# Patient Record
Sex: Female | Born: 1975 | Race: White | Hispanic: No | Marital: Married | Smoking: Never smoker
Health system: Southern US, Community
[De-identification: ages and names within clinical notes are randomized; demographics above are authoritative.]

## PROBLEM LIST (undated history)

## (undated) DIAGNOSIS — G43909 Migraine, unspecified, not intractable, without status migrainosus: Secondary | ICD-10-CM

## (undated) DIAGNOSIS — T7840XA Allergy, unspecified, initial encounter: Secondary | ICD-10-CM

## (undated) DIAGNOSIS — J45909 Unspecified asthma, uncomplicated: Secondary | ICD-10-CM

## (undated) DIAGNOSIS — R51 Headache: Secondary | ICD-10-CM

## (undated) DIAGNOSIS — R519 Headache, unspecified: Secondary | ICD-10-CM

## (undated) HISTORY — DX: Migraine, unspecified, not intractable, without status migrainosus: G43.909

## (undated) HISTORY — DX: Allergy, unspecified, initial encounter: T78.40XA

## (undated) HISTORY — DX: Unspecified asthma, uncomplicated: J45.909

## (undated) HISTORY — DX: Headache, unspecified: R51.9

## (undated) HISTORY — DX: Headache: R51

---

## 2001-04-17 ENCOUNTER — Other Ambulatory Visit: Admission: RE | Admit: 2001-04-17 | Discharge: 2001-04-17 | Payer: Self-pay | Admitting: Obstetrics & Gynecology

## 2001-09-02 ENCOUNTER — Inpatient Hospital Stay (HOSPITAL_COMMUNITY): Admission: AD | Admit: 2001-09-02 | Discharge: 2001-09-02 | Payer: Self-pay | Admitting: Obstetrics and Gynecology

## 2001-10-18 ENCOUNTER — Inpatient Hospital Stay (HOSPITAL_COMMUNITY): Admission: AD | Admit: 2001-10-18 | Discharge: 2001-10-18 | Payer: Self-pay | Admitting: Obstetrics & Gynecology

## 2001-10-18 ENCOUNTER — Encounter: Payer: Self-pay | Admitting: Obstetrics and Gynecology

## 2001-10-21 ENCOUNTER — Inpatient Hospital Stay (HOSPITAL_COMMUNITY): Admission: AD | Admit: 2001-10-21 | Discharge: 2001-10-21 | Payer: Self-pay | Admitting: Obstetrics and Gynecology

## 2001-10-21 ENCOUNTER — Encounter: Payer: Self-pay | Admitting: Obstetrics & Gynecology

## 2001-11-01 ENCOUNTER — Inpatient Hospital Stay (HOSPITAL_COMMUNITY): Admission: AD | Admit: 2001-11-01 | Discharge: 2001-11-04 | Payer: Self-pay | Admitting: Obstetrics and Gynecology

## 2002-08-31 ENCOUNTER — Emergency Department (HOSPITAL_COMMUNITY): Admission: EM | Admit: 2002-08-31 | Discharge: 2002-08-31 | Payer: Self-pay | Admitting: Emergency Medicine

## 2003-02-19 ENCOUNTER — Ambulatory Visit (HOSPITAL_COMMUNITY): Admission: RE | Admit: 2003-02-19 | Discharge: 2003-02-19 | Payer: Self-pay | Admitting: Obstetrics & Gynecology

## 2003-02-19 ENCOUNTER — Encounter: Payer: Self-pay | Admitting: Obstetrics & Gynecology

## 2003-02-20 ENCOUNTER — Encounter: Admission: RE | Admit: 2003-02-20 | Discharge: 2003-02-20 | Payer: Self-pay | Admitting: Obstetrics & Gynecology

## 2003-02-20 ENCOUNTER — Encounter: Payer: Self-pay | Admitting: Obstetrics & Gynecology

## 2004-03-11 ENCOUNTER — Other Ambulatory Visit: Admission: RE | Admit: 2004-03-11 | Discharge: 2004-03-11 | Payer: Self-pay | Admitting: Obstetrics & Gynecology

## 2004-09-09 ENCOUNTER — Encounter: Admission: RE | Admit: 2004-09-09 | Discharge: 2004-09-09 | Payer: Self-pay | Admitting: Obstetrics and Gynecology

## 2005-02-24 ENCOUNTER — Encounter: Admission: RE | Admit: 2005-02-24 | Discharge: 2005-02-24 | Payer: Self-pay | Admitting: Obstetrics & Gynecology

## 2005-03-06 ENCOUNTER — Encounter: Admission: RE | Admit: 2005-03-06 | Discharge: 2005-03-06 | Payer: Self-pay | Admitting: Obstetrics & Gynecology

## 2005-09-01 ENCOUNTER — Encounter: Admission: RE | Admit: 2005-09-01 | Discharge: 2005-09-01 | Payer: Self-pay | Admitting: Obstetrics & Gynecology

## 2005-09-06 ENCOUNTER — Encounter: Admission: RE | Admit: 2005-09-06 | Discharge: 2005-09-06 | Payer: Self-pay | Admitting: Obstetrics & Gynecology

## 2006-02-26 ENCOUNTER — Encounter: Admission: RE | Admit: 2006-02-26 | Discharge: 2006-02-26 | Payer: Self-pay | Admitting: Obstetrics & Gynecology

## 2007-03-22 ENCOUNTER — Encounter: Admission: RE | Admit: 2007-03-22 | Discharge: 2007-03-22 | Payer: Self-pay | Admitting: Obstetrics & Gynecology

## 2007-06-26 ENCOUNTER — Encounter: Admission: RE | Admit: 2007-06-26 | Discharge: 2007-06-26 | Payer: Self-pay | Admitting: Obstetrics & Gynecology

## 2007-10-01 ENCOUNTER — Encounter: Admission: RE | Admit: 2007-10-01 | Discharge: 2007-10-01 | Payer: Self-pay | Admitting: Obstetrics & Gynecology

## 2010-04-15 ENCOUNTER — Ambulatory Visit: Payer: Self-pay | Admitting: Family Medicine

## 2010-04-15 DIAGNOSIS — M7750 Other enthesopathy of unspecified foot: Secondary | ICD-10-CM | POA: Insufficient documentation

## 2010-04-15 DIAGNOSIS — M25579 Pain in unspecified ankle and joints of unspecified foot: Secondary | ICD-10-CM | POA: Insufficient documentation

## 2010-05-25 ENCOUNTER — Ambulatory Visit: Payer: Self-pay | Admitting: Sports Medicine

## 2010-12-20 NOTE — Assessment & Plan Note (Signed)
Summary: F/U FOOT,MC   History of Present Illness: 35 yo F here for 6 week f/u of left peroneal tendinopathy.  patient reports she is 98% improved. Used voltaren gel and aleve for a week after last visit. Had cross trained and continues to do so Running 15 miles a week Only pain in past couple weeks was when she ran at the beach in sand. No swelling or bruising. using comforthotics with scaphoid pads - felt too post so moved forward.  Allergies (verified): 1)  ! Phenergan  Physical Exam  General:  Well-developed,well-nourished,in no acute distress; alert,appropriate and cooperative throughout examination Msk:  L foot/ankle: No gross deformity, bruising.  No swelling. No TTP peroneal tendons - much improved. No TTP lat malleolus, base 5th metatarsal, or elsewhere about foot or ankle. FROM ankle. No pain with resisted plantarflexion and ext rotation. Neg ant drawer and talar tilt. NV intact distally. Cavus feet bilaterally   Impression & Recommendations:  Problem # 1:  ANKLE PAIN, LEFT (ICD-719.47) Assessment Improved Made new pair of insoles with scaphoid pads.  Theraband exercises - internal rotation and plantarflexion 3 sets of 15 at least 3 times a week for 6 weeks.  Given website and shown how to order insoles/pads.  Consider custom orthotics - will check with her insurance company.  F/u as needed.  Orders: Sports Insoles (L3510) Theraband per yard (A9300)  Problem # 2:  OTHER ENTHESOPATHY OF ANKLE AND TARSUS (ICD-726.79) Assessment: Improved  Orders: Sports Insoles (L3510) Theraband per yard (A9300)  Other Orders: Foot Orthosis ( Arch Strap/Heel Cup) [phone])

## 2010-12-20 NOTE — Assessment & Plan Note (Signed)
Summary: NP,ANKLE/LEG PAIN,RUNNER,MC   Vital Signs:  Patient profile:   35 year old female Height:      68 inches Weight:      147 pounds BMI:     22.43 BP sitting:   116 / 74  Vitals Entered By: Lillia Pauls CMA (Apr 15, 2010 8:56 AM)  History of Present Illness: 35 yo F here for new left foot/ankle pain  Patient reports 3 1/2 weeks of slowly worsening left lateral foot pain No known injury Started initially after she was running Now hurts through entire run Has had some swelling No pop or bruising. No prior h/o stress fracture Tried voltaren gel which did help some Feels good some days and tries to go out and run - worsens pain. Usually runs 16-18 miles per week (3-4 times a week running, elliptical to cross train) Hurts some with elliptical as well. May have started when she was running on an irregular surface.  Allergies (verified): 1)  ! Phenergan  Physical Exam  General:  Well-developed,well-nourished,in no acute distress; alert,appropriate and cooperative throughout examination Msk:  L foot/ankle: No gross deformity, bruising.  ? ice burn lateral ankle.  Minimal increased swelling post to lat malleolus compared to R. TTP peroneal tendons as they cross malleolus.  No TTP lat malleolus, base 5th metatarsal, or elsewhere about foot or ankle. FROM ankle. Mild pain with resisted plantarflexion and ext rotation. Neg ant drawer and talar tilt. NV intact distally. Cavus feet bilaterally No leg length inequality Additional Exam:  MSK u/s L ankle:  No edema, bony irregularities, or increased neovascularity over base 5th or lateral malleolus.  No visible tears within peroneal tendons though they are thickened relative to right sided peroneal tendons.  Small increased fluid surrounding tendons compared to right as well.  No increased neovascularity however.   Impression & Recommendations:  Problem # 1:  OTHER ENTHESOPATHY OF ANKLE AND TARSUS (ICD-726.79) Assessment  New  Peroneal tendinopathy.  Relative rest - back off running for next 2 weeks, focus on cross training.  Running on uneven surface + cavus feet seem to be factors leading to this.  Given comforthotics with small scaphoid pads to help distibute force across feet and cause less stress on peroneal tendons - very neutral gait in these and no increased pain.  Voltaren gel and/or aleve for pain.  At next visit consider theraband exercises and/or formal PT.  Orders: Sports Insoles [phone])  Problem # 2:  ANKLE PAIN, LEFT (ICD-719.47) Assessment: New  See #1 above.  Orders: Sports Insoles [phone])  Patient Instructions: 1)  You have peroneal tendinitis. 2)  Wear the green insoles with scaphoid pads - slip these in and out of your other shoes as well. 3)  Ice the area (ice bucket if you can tolerate) for 10 minutes at a time up to 3-4 times a day. 4)  Focus on cross training for the next 2 weeks without running - biking would be ideal and elliptical is ok if it does not increase your pain. 5)  In 2 weeks start a walk:jog program to see how you tolerate this.  Start with 1 minute:1 minute for 15 minutes - increase the jog time as long as pain does not increase. 6)  Use voltaren gel over the area or you can try aleve 2 tablets twice a day with food. 7)  Follow up with [place] in 4 weeks to see how you are doing with this.

## 2011-04-07 NOTE — H&P (Signed)
[facility]  Patient:    Latasha Black, Latasha Black Visit Number: [id number] MRN: [id number]          Service Type: OBS Location: [facility] Attending Physician:  Lenoard Aden Dictated by:   Silverio Lay, M.D. Admit Date:  10/21/2001 Discharge Date: 10/21/2001                           History and Physical  REASON FOR ADMISSION:         Intrauterine pregnancy at 37 weeks with regular uterine contractions.  HISTORY OF PRESENT ILLNESS:   This is a 35 year old married white female, gravida 2, para 1, with a due date of November 24, 2001, being admitted at 37 weeks with regular uterine contractions and cervical change.  She has been experiencing contractions every three minutes, which have increased in intensity after walking for an hour.  Denied any leaking of fluid, denied any bleeding and reported good fetal activity.  She also denies pregnancy-induced hypertension symptoms.  She has shown cervical change after allowing her to ambulate for an hour and a half and so decision was made to admit her in labor and delivery.  Prenatal course reveals blood type A-positive, RPR nonreactive, rubella immune, HBsAg negative, HIV negative, gonorrhea negative, Chlamydia negative. Sixteen-week AFP was within normal limits.  Twenty-week ultrasound revealed normal anatomic survey, anterior placenta and normal cervical length. Twenty-eight-week glucose tolerance test was within normal limits.  Prenatal course is remarkable for late trimester unexplained fetal bradycardia found in the office.  Patient was sent to [facility] for complete evaluation and echocardiogram.  Fetal heart rate and rhythm were normal at that time and cardiac evaluation was negative.  Group B strep is negative.  ALLERGIES:                    No known drug allergies.  PAST MEDICAL HISTORY:         In March 2000, spontaneous vaginal delivery with vacuum assistance at 40-plus weeks, baby  weighing 7 pounds 1 ounce.  History of thyroiditis at age 13, now normal thyroid function.  History of first trimester spotting.  FAMILY HISTORY:               Father with hypertension, status post bypass surgery.  Maternal grandmother with diabetes.  Maternal grandfather with diabetes.  SOCIAL HISTORY:               Married, nonsmoker, is a Doctor, general practice.   PHYSICAL EXAMINATION:  VITAL SIGNS:                  Normal.  HEENT:                        Negative.  LUNGS:                        Clear.  HEART:                        Normal.  ABDOMEN:                      Gravid and nontender, size adequate for dates. Fetal heart rate tracings reactive.  PELVIC:                       Vaginal exam by admitting  nurse 4 cm, 70%, vertex -1.  EXTREMITIES:                  Negative.  ASSESSMENT:                   Intrauterine pregnancy at 37 weeks, in early labor.  PLAN:                         Patient will be admitted to labor and delivery, pain management as needed, spontaneous vaginal delivery expected. Dictated by:   Silverio Lay, M.D. Attending Physician:  Lenoard Aden DD:  11/02/01 TD:  11/02/01 Job: [id number]

## 2014-07-28 ENCOUNTER — Other Ambulatory Visit: Payer: Self-pay

## 2014-07-28 DIAGNOSIS — Z1231 Encounter for screening mammogram for malignant neoplasm of breast: Secondary | ICD-10-CM

## 2014-08-07 ENCOUNTER — Ambulatory Visit
Admission: RE | Admit: 2014-08-07 | Discharge: 2014-08-07 | Disposition: A | Discharge status: Home / Self Care | Payer: 59 | Source: Ambulatory Visit

## 2014-08-07 ENCOUNTER — Encounter (INDEPENDENT_AMBULATORY_CARE_PROVIDER_SITE_OTHER): Payer: Self-pay

## 2014-08-07 DIAGNOSIS — Z1231 Encounter for screening mammogram for malignant neoplasm of breast: Secondary | ICD-10-CM

## 2015-07-15 ENCOUNTER — Ambulatory Visit (INDEPENDENT_AMBULATORY_CARE_PROVIDER_SITE_OTHER): Payer: 59 | Admitting: Family Medicine

## 2015-07-15 ENCOUNTER — Encounter: Payer: Self-pay | Admitting: Family Medicine

## 2015-07-15 VITALS — BP 124/72 | HR 78 | Ht 69.0 in | Wt 145.0 lb

## 2015-07-15 DIAGNOSIS — M9908 Segmental and somatic dysfunction of rib cage: Secondary | ICD-10-CM

## 2015-07-15 DIAGNOSIS — M9901 Segmental and somatic dysfunction of cervical region: Secondary | ICD-10-CM | POA: Diagnosis not present

## 2015-07-15 DIAGNOSIS — R293 Abnormal posture: Secondary | ICD-10-CM | POA: Diagnosis not present

## 2015-07-15 DIAGNOSIS — M9902 Segmental and somatic dysfunction of thoracic region: Secondary | ICD-10-CM

## 2015-07-15 DIAGNOSIS — M999 Biomechanical lesion, unspecified: Secondary | ICD-10-CM | POA: Insufficient documentation

## 2015-07-15 NOTE — Assessment & Plan Note (Signed)
Patient seems to have significantly poor posture overall. We discussed about different scapular strengthening exercises and postural control things. We discussed ergonomics at work. We discussed over-the-counter natural supplementations. We discussed icing regimen. Patient try to make these changes and come back and see me again in 3 weeks for further evaluation and treatment. Patient did respond very well to osteopathic manipulation.

## 2015-07-15 NOTE — Assessment & Plan Note (Signed)
Decision today to treat with OMT was based on Physical Exam  After verbal consent patient was treated with HVLA, ME, FPR techniques in  Cervical, thoracic and rib areas  Patient tolerated the procedure well with improvement in symptoms  Patient given exercises, stretches and lifestyle modifications  See medications in patient instructions if given  Patient will follow up in 3 weeks

## 2015-07-15 NOTE — Progress Notes (Signed)
Pre visit review using our clinic review tool, if applicable. No additional management support is needed unless otherwise documented below in the visit note. 

## 2015-07-15 NOTE — Progress Notes (Signed)
[facility] [place], [place] [address] Phone: [phone] Subjective:     CC: Neck pain  [id number] [place] is a 39 y.o. female coming in with complaint of  Upper neck and back pain. Patient has had this for quite some time. Thinks it's more positional when someone related distress. Patient did have 2 recent injuries when she was at the motion. Patient dictated her head but states that she is not having any headaches or any discomfort. States though that the left side of her neck seems to be a little more tight. Patient denies any radiation down the legs any numbness or tingling. Denies any weakness of the upper extremities. Patient states that it just seems to be localized in the more of a dull throbbing aching sensation. Patient has started running recently and would like to run on a more regular basis. Puts the severity of pain is 4 out of 10. Nothing that stops her from daily activities and does not wake her up at night.  Past Medical History  Diagnosis Date  . Asthma   . Headache   . Allergy   . Migraine    History reviewed. No pertinent past surgical history. Social History  Substance Use Topics  . Smoking status: Never Smoker   . Smokeless tobacco: Never Used  . Alcohol Use: Yes   Allergies  Allergen Reactions  . Promethazine Hcl    History reviewed. No pertinent family history.      Past medical history, social, surgical and family history all reviewed in electronic medical record.   Review of Systems: No headache, visual changes, nausea, vomiting, diarrhea, constipation, dizziness, abdominal pain, skin rash, fevers, chills, night sweats, weight loss, swollen lymph nodes, body aches, joint swelling, muscle aches, chest pain, shortness of breath, mood changes.   Objective Blood pressure 124/72, pulse 78, height 5\' 9"  (1.753 m), weight 145 lb (65.772 kg), SpO2 99 %.  General: No apparent distress alert and oriented  x3 mood and affect normal, dressed appropriately.  HEENT: Pupils equal, extraocular movements intact  Respiratory: Patient's speak in full sentences and does not appear short of breath  Cardiovascular: No lower extremity edema, non tender, no erythema  Skin: Warm dry intact with no signs of infection or rash on extremities or on axial skeleton.  Abdomen: Soft nontender  Neuro: Cranial nerves II through XII are intact, neurovascularly intact in all extremities with 2+ DTRs and 2+ pulses.  Lymph: No lymphadenopathy of posterior or anterior cervical chain or axillae bilaterally.  Gait normal with good balance and coordination.  MSK:  Non tender with full range of motion and good stability and symmetric strength and tone of shoulders, elbows, wrist, hip, knee and ankles bilaterally.  Back Exam:  Inspection:  Mild increase in thoracic kyphosis Motion: Flexion 45 deg, Extension 45 deg, Side Bending to 45 deg bilaterally,  Rotation to 45 deg bilaterally  SLR laying: Negative  XSLR laying: Negative  Palpable tenderness: None. FABER: negative. Sensory change: Gross sensation intact to all lumbar and sacral dermatomes.  Reflexes: 2+ at both patellar tendons, 2+ at achilles tendons, Babinski's downgoing.  Strength at foot  Plantar-flexion: 5/5 Dorsi-flexion: 5/5 Eversion: 5/5 Inversion: 5/5  Leg strength  Quad: 5/5 Hamstring: 4/5 Hip flexor: 4/5 Hip abductors: 4/5  Gait unremarkable.   osteopathic findings  Cervical  C2 flexed rotated and side bent right  C4 flexed rotated and side bent left  T1 extended rotated and side bent  left with elevated first rib  T5 extended rotated and side bent right   Impression and Recommendations:     This case required medical decision making of moderate complexity.

## 2015-07-15 NOTE — Patient Instructions (Addendum)
Good to see you! Ice 20 minutes 2 times daily. Usually after activity and before bed. pennsaid pinkie amount topically 2 times daily as needed.  Stand on wall with heels, butt shoulder and head touching for a goal of 5 minutes daily.  Y-T-A exercises 2 second holds are repeat 10 times Focus on posture through the day.  Consider music stand  Or tennis ball between shoulder blades.  Consider turmeric 500mg  twice daily See me again in 4 weeks.

## 2015-08-12 ENCOUNTER — Encounter: Payer: Self-pay | Admitting: Family Medicine

## 2015-08-12 ENCOUNTER — Ambulatory Visit (INDEPENDENT_AMBULATORY_CARE_PROVIDER_SITE_OTHER): Payer: 59 | Admitting: Family Medicine

## 2015-08-12 VITALS — BP 110/62 | HR 57 | Ht 69.0 in | Wt 149.0 lb

## 2015-08-12 DIAGNOSIS — M9903 Segmental and somatic dysfunction of lumbar region: Secondary | ICD-10-CM

## 2015-08-12 DIAGNOSIS — G5781 Other specified mononeuropathies of right lower limb: Secondary | ICD-10-CM | POA: Diagnosis not present

## 2015-08-12 DIAGNOSIS — M9901 Segmental and somatic dysfunction of cervical region: Secondary | ICD-10-CM

## 2015-08-12 DIAGNOSIS — M9902 Segmental and somatic dysfunction of thoracic region: Secondary | ICD-10-CM | POA: Diagnosis not present

## 2015-08-12 DIAGNOSIS — G5780 Other specified mononeuropathies of unspecified lower limb: Secondary | ICD-10-CM | POA: Insufficient documentation

## 2015-08-12 DIAGNOSIS — R293 Abnormal posture: Secondary | ICD-10-CM

## 2015-08-12 DIAGNOSIS — M999 Biomechanical lesion, unspecified: Secondary | ICD-10-CM

## 2015-08-12 NOTE — Patient Instructions (Signed)
Good to see you Duexis 3 times daily for 6 days Ice 20 minutes 2 times daily. Usually after activity and before bed. Compression sleeve to thigh with exercise Exercises 3 times a week.  See me again in 3-4 weeks

## 2015-08-12 NOTE — Progress Notes (Signed)
Pre visit review using our clinic review tool, if applicable. No additional management support is needed unless otherwise documented below in the visit note. 

## 2015-08-12 NOTE — Assessment & Plan Note (Signed)
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 Patient given home exercises to try to stretcher, short course of anti-rheumatoid's, icing protocol, and patient will return in 3 weeks. If having any worsening symptoms then we will consider ultrasound and potentially injection. No likely hernia.

## 2015-08-12 NOTE — Progress Notes (Signed)
[facility] [place], [place] [address] Phone: [phone] Subjective:     CC: Neck pain follow-up  [id number] Latasha Black is a 39 y.o. female coming in with complaint of  Upper neck and back pain. Patient states that her neck seems to be doing much better. Doing the exercises occasionally. Did respond very well to osteopathic manipulation.  Patient is having a new problem. Patient did take a step wrong and felt a severe pain mostly on the posterior aspect of the right buttocks. States that certain activities seem to give her more difficulty. Patient denies any significant radiation down the back and leg or any weakness. States that sitting for long amount of time in the area can be very painful. Denies noticing any swelling. Rates the severity is 6 out of 10.  Past Medical History  Diagnosis Date  . Asthma   . Headache   . Allergy   . Migraine    No past surgical history on file. Social History  Substance Use Topics  . Smoking status: Never Smoker   . Smokeless tobacco: Never Used  . Alcohol Use: Yes   Allergies  Allergen Reactions  . Promethazine Hcl    No family history on file.      Past medical history, social, surgical and family history all reviewed in electronic medical record.   Review of Systems: No headache, visual changes, nausea, vomiting, diarrhea, constipation, dizziness, abdominal pain, skin rash, fevers, chills, night sweats, weight loss, swollen lymph nodes, body aches, joint swelling, muscle aches, chest pain, shortness of breath, mood changes.   Objective Blood pressure 110/62, pulse 57, height 5\' 9"  (1.753 m), weight 149 lb (67.586 kg), SpO2 99 %.  General: No apparent distress alert and oriented x3 mood and affect normal, dressed appropriately.  HEENT: Pupils equal, extraocular movements intact  Respiratory: Patient's speak in full sentences and does not appear short of breath  Cardiovascular: No  lower extremity edema, non tender, no erythema  Skin: Warm dry intact with no signs of infection or rash on extremities or on axial skeleton.  Abdomen: Soft nontender  Neuro: Cranial nerves II through XII are intact, neurovascularly intact in all extremities with 2+ DTRs and 2+ pulses.  Lymph: No lymphadenopathy of posterior or anterior cervical chain or axillae bilaterally.  Gait normal with good balance and coordination.  MSK:  Non tender with full range of motion and good stability and symmetric strength and tone of shoulders, elbows, wrist, hip, knee and ankles bilaterally.  Back Exam:  Inspection: Unremarkable Motion: Flexion 45 deg, Extension 45 deg, Side Bending to 45 deg bilaterally,  Rotation to 45 deg bilaterally  SLR laying: Negative  XSLR laying: Negative  Palpable tenderness: Mild paraspinal's tenderness of the cervical spine bilaterally patient also has tenderness to palpation on the posterior aspect near the lateral aspect of the ilium on the right side inferiorly. FABER: negative. Sensory change: Gross sensation intact to all lumbar and sacral dermatomes.  Reflexes: 2+ at both patellar tendons, 2+ at achilles tendons, Babinski's downgoing.  Strength at foot  Plantar-flexion: 5/5 Dorsi-flexion: 5/5 Eversion: 5/5 Inversion: 5/5  Leg strength  Quad: 5/5 Hamstring: 4/5 Hip flexor: 4/5 Hip abductors: 4/5  Gait unremarkable.   osteopathic findings  Cervical  C2 flexed rotated and side bent right  C4 flexed rotated and side bent left  T1 extended rotated and side bent left with elevated first rib  T5 extended rotated and side bent right L2  flexed rotated and side bent right   Impression and Recommendations:     This case required medical decision making of moderate complexity.

## 2015-08-12 NOTE — Assessment & Plan Note (Signed)
Encouraged patient to do more with core strengthening as well as continue the posture. Patient has responded very well to osteopathic manipulation. Patient will continue her other conservative therapy and see me again in 3-4 weeks.

## 2015-08-12 NOTE — Assessment & Plan Note (Addendum)
Decision today to treat with OMT was based on Physical Exam  After verbal consent patient was treated with HVLA, ME, FPR techniques in  Cervical, thoracic and rib an lumbar areas  Patient tolerated the procedure well with improvement in symptoms  Patient given exercises, stretches and lifestyle modifications  See medications in patient instructions if given  Patient will follow up in 3 weeks

## 2015-09-09 ENCOUNTER — Encounter: Payer: Self-pay | Admitting: Family Medicine

## 2015-09-09 ENCOUNTER — Ambulatory Visit (INDEPENDENT_AMBULATORY_CARE_PROVIDER_SITE_OTHER): Payer: 59 | Admitting: Family Medicine

## 2015-09-09 VITALS — BP 110/64 | HR 60 | Ht 69.0 in | Wt 150.0 lb

## 2015-09-09 DIAGNOSIS — M9903 Segmental and somatic dysfunction of lumbar region: Secondary | ICD-10-CM

## 2015-09-09 DIAGNOSIS — M9901 Segmental and somatic dysfunction of cervical region: Secondary | ICD-10-CM

## 2015-09-09 DIAGNOSIS — G5781 Other specified mononeuropathies of right lower limb: Secondary | ICD-10-CM

## 2015-09-09 DIAGNOSIS — R293 Abnormal posture: Secondary | ICD-10-CM

## 2015-09-09 DIAGNOSIS — M9902 Segmental and somatic dysfunction of thoracic region: Secondary | ICD-10-CM

## 2015-09-09 DIAGNOSIS — M999 Biomechanical lesion, unspecified: Secondary | ICD-10-CM

## 2015-09-09 MED ORDER — IBUPROFEN-FAMOTIDINE 800-26.6 MG PO TABS: ORAL_TABLET | ORAL | Status: DC

## 2015-09-09 MED ORDER — GABAPENTIN 100 MG PO CAPS: 200.0000 mg | ORAL_CAPSULE | Freq: Every day | ORAL | Status: DC

## 2015-09-09 NOTE — Patient Instructions (Signed)
Good to see you Ice 20 minutes 2 times daily. Usually after activity and before bed. Continue the stretches a little for the backside Stay active and keep running Duexis 2 times daily for the next month.  Shorten your stride Otherwise see me again in 4 weeks.

## 2015-09-09 NOTE — Progress Notes (Signed)
Pre visit review using our clinic review tool, if applicable. No additional management support is needed unless otherwise documented below in the visit note. 

## 2015-09-09 NOTE — Assessment & Plan Note (Signed)
Interesting patient to continue to work on posture control. Continue to have her to need to be active. Patient will continue to try to do more strengthening. We discussed different diet changes. Patient come back and see me again in 4 weeks.

## 2015-09-09 NOTE — Assessment & Plan Note (Signed)
Decision today to treat with OMT was based on Physical Exam  After verbal consent patient was treated with HVLA, ME, FPR techniques in  Cervical, thoracic and rib an lumbar areas  Patient tolerated the procedure well with improvement in symptoms  Patient given exercises, stretches and lifestyle modifications  See medications in patient instructions if given  Patient will follow up in 3-4 weeks

## 2015-09-09 NOTE — Progress Notes (Signed)
[facility] [place], [place] [address] Phone: [phone] Subjective:     CC: Neck pain follow-up right buttock pain follow up  [id number] [place] is a 39 y.o. female coming in with complaint of  Upper neck and back pain. Patient states that her neck seems to be doing much better. Doing the exercises occasionally. Did respond very well to osteopathic manipulation. Patient continues to do well. Trying to stay active. Running 5K 2-3 times a week. Patient tries to remain active overall. No significant discomfort but the back.  Patient is having a sided buttock pain. Was having some radicular symptoms as well to the groin area. We will concern for more of an entrapment of the obturator nerve. Patient did have some osteopathic manipulation and we will give patient some home exercises. Patient states it does seem to be better when she was taking the anti-inflammatories. Seemed to get worse again after she was done with the anti-inflammatories. Patient states sometimes it can be intermittent for no apparent reason and sometimes seems to be worse with increasing activity but usually after the activity. Sometimes can even hurt her when sitting for long amount of time. Sometimes radicular symptoms seems to be going down the posterior as well as the anterior aspect the leg occasionally. Not stopping her from activity.  Past Medical History  Diagnosis Date  . Asthma   . Headache   . Allergy   . Migraine    No past surgical history on file. Social History  Substance Use Topics  . Smoking status: Never Smoker   . Smokeless tobacco: Never Used  . Alcohol Use: Yes   Allergies  Allergen Reactions  . Promethazine Hcl    No family history on file.      Past medical history, social, surgical and family history all reviewed in electronic medical record.   Review of Systems: No headache, visual changes, nausea, vomiting, diarrhea, constipation,  dizziness, abdominal pain, skin rash, fevers, chills, night sweats, weight loss, swollen lymph nodes, body aches, joint swelling, muscle aches, chest pain, shortness of breath, mood changes.   Objective Blood pressure 110/64, pulse 60, height 5\' 9"  (1.753 m), weight 150 lb (68.04 kg), SpO2 99 %.  General: No apparent distress alert and oriented x3 mood and affect normal, dressed appropriately.  HEENT: Pupils equal, extraocular movements intact  Respiratory: Patient's speak in full sentences and does not appear short of breath  Cardiovascular: No lower extremity edema, non tender, no erythema  Skin: Warm dry intact with no signs of infection or rash on extremities or on axial skeleton.  Abdomen: Soft nontender  Neuro: Cranial nerves II through XII are intact, neurovascularly intact in all extremities with 2+ DTRs and 2+ pulses.  Lymph: No lymphadenopathy of posterior or anterior cervical chain or axillae bilaterally.  Gait normal with good balance and coordination.  MSK:  Non tender with full range of motion and good stability and symmetric strength and tone of shoulders, elbows, wrist, hip, knee and ankles bilaterally.  Back Exam:  Inspection: Unremarkable Motion: Flexion 45 deg, Extension 45 deg, Side Bending to 45 deg bilaterally,  Rotation to 45 deg bilaterally  SLR laying: Negative  XSLR laying: Negative  Palpable tenderness: Nontender on exam today with mild tightness of the neck as well as the lower back. FABER: negative. Sensory change: Gross sensation intact to all lumbar and sacral dermatomes.  Reflexes: 2+ at both patellar tendons, 2+ at achilles tendons, Babinski's downgoing.  Strength at foot  Plantar-flexion: 5/5 Dorsi-flexion: 5/5 Eversion: 5/5 Inversion: 5/5  Leg strength  Quad: 5/5 Hamstring: 4/5 Hip flexor: 4/5 Hip abductors: 4/5  Gait unremarkable.   osteopathic findings  Cervical  C2 flexed rotated and side bent right  C4 flexed rotated and side bent left  T1  extended rotated and side bent left with elevated first rib  T5 extended rotated and side bent right L2 flexed rotated and side bent right Sacrum left on left   Impression and Recommendations:     This case required medical decision making of moderate complexity.

## 2015-09-09 NOTE — Assessment & Plan Note (Signed)
Patient continues to have some difficulty. I do think that this still likely the obturator nerve. We discussed home exercises, icing pedicle, which activities to do an which was potentially avoid. Patient has had a make these changes and come back and see me again in 3-4 weeks for further evaluation and treatment. If continuing have pain we may need to consider further imaging. Possible injection.

## 2015-10-06 ENCOUNTER — Other Ambulatory Visit: Payer: Self-pay

## 2015-10-06 DIAGNOSIS — Z1231 Encounter for screening mammogram for malignant neoplasm of breast: Secondary | ICD-10-CM

## 2015-10-07 ENCOUNTER — Other Ambulatory Visit (INDEPENDENT_AMBULATORY_CARE_PROVIDER_SITE_OTHER): Payer: 59

## 2015-10-07 ENCOUNTER — Encounter: Payer: Self-pay | Admitting: Family Medicine

## 2015-10-07 ENCOUNTER — Ambulatory Visit (INDEPENDENT_AMBULATORY_CARE_PROVIDER_SITE_OTHER): Payer: 59 | Admitting: Family Medicine

## 2015-10-07 VITALS — BP 112/70 | HR 60 | Ht 69.0 in | Wt 149.0 lb

## 2015-10-07 DIAGNOSIS — M9903 Segmental and somatic dysfunction of lumbar region: Secondary | ICD-10-CM

## 2015-10-07 DIAGNOSIS — M9902 Segmental and somatic dysfunction of thoracic region: Secondary | ICD-10-CM | POA: Diagnosis not present

## 2015-10-07 DIAGNOSIS — M9901 Segmental and somatic dysfunction of cervical region: Secondary | ICD-10-CM

## 2015-10-07 DIAGNOSIS — G5780 Other specified mononeuropathies of unspecified lower limb: Secondary | ICD-10-CM | POA: Insufficient documentation

## 2015-10-07 DIAGNOSIS — M25551 Pain in right hip: Secondary | ICD-10-CM | POA: Diagnosis not present

## 2015-10-07 DIAGNOSIS — G5781 Other specified mononeuropathies of right lower limb: Secondary | ICD-10-CM

## 2015-10-07 DIAGNOSIS — M999 Biomechanical lesion, unspecified: Secondary | ICD-10-CM

## 2015-10-07 MED ORDER — PREDNISONE 50 MG PO TABS: 50.0000 mg | ORAL_TABLET | Freq: Every day | ORAL | Status: DC

## 2015-10-07 MED ORDER — NITROGLYCERIN 0.2 MG/HR TD PT24
MEDICATED_PATCH | TRANSDERMAL | Status: DC
Start: 2015-10-07 — End: 2015-10-25

## 2015-10-07 NOTE — Assessment & Plan Note (Signed)
Decision today to treat with OMT was based on Physical Exam  After verbal consent patient was treated with HVLA, ME, FPR techniques in  Cervical, thoracic and rib an lumbar areas  Patient tolerated the procedure well with improvement in symptoms  Patient given exercises, stretches and lifestyle modifications  See medications in patient instructions if given  Patient will follow up in 3-4 weeks      

## 2015-10-07 NOTE — Assessment & Plan Note (Signed)
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 We do see signs and symptoms of this on ultrasound today. We discussed different treatment options. Patient has elected try decreasing her running, prednisone, gabapentin to take on a more regular basis. We also discussed nitroglycerin. Warned of potential side effects. Patient isn't to try all these and come back and see me again in approximately 2 weeks. Before making improvement we will continue with conservative therapy. If not we need to consider possible injection in the area.

## 2015-10-07 NOTE — Patient Instructions (Signed)
Good to see you Prednisone daily for 5 days and stop the nsaid Gabapentin 300mg  nightly Nitroglycerin Protocol   Apply 1/4 nitroglycerin patch to affected area daily.  Change position of patch within the affected area every 24 hours.  You may experience a headache during the first 1-2 weeks of using the patch, these should subside.  If you experience headaches after beginning nitroglycerin patch treatment, you may take your preferred over the counter pain reliever.  Another side effect of the nitroglycerin patch is skin irritation or rash related to patch adhesive.  Please notify our office if you develop more severe headaches or rash, and stop the patch.  Tendon healing with nitroglycerin patch may require 12 to 24 weeks depending on the extent of injury.  Men should not use if taking Viagra, Cialis, or Levitra.   Do not use if you have migraines or rosacea.  No running for 10 days  See me again in 2ish weeks.

## 2015-10-07 NOTE — Progress Notes (Signed)
Pre visit review using our clinic review tool, if applicable. No additional management support is needed unless otherwise documented below in the visit note. 

## 2015-10-07 NOTE — Progress Notes (Signed)
[facility] [address], [place] [address] Phone: [phone] Subjective:     CC: Neck pain follow-up right buttock pain follow up  [id number] Latasha Black is a 39 y.o. female coming in with complaint of  Upper neck and back pain. Patient states that her neck seems to be doing much better. Doing the exercises occasionally. Did respond very well to osteopathic manipulation. Patient continues to do well. Trying to stay active. Mild tightness but not as significant as what it was previously.  Patient is having a sided buttock pain. Was having some radicular symptoms as well to the groin area. We will concern for more of an entrapment of the obturator nerve. Patient did have some osteopathic manipulation and we will give patient some home exercises. Patient was also started on gabapentin. Patient continues to have some mild discomfort. Patient states that it is continuing to stop her from running on a regular basis. Continuing to need the ibuprofen on a regular basis. Not noticing any significant improvement with the gabapentin. Continues to have the radicular symptoms.  Past Medical History  Diagnosis Date  . Asthma   . Headache   . Allergy   . Migraine    No past surgical history on file. Social History  Substance Use Topics  . Smoking status: Never Smoker   . Smokeless tobacco: Never Used  . Alcohol Use: Yes   Allergies  Allergen Reactions  . Promethazine Hcl    No family history on file.      Past medical history, social, surgical and family history all reviewed in electronic medical record.   Review of Systems: No headache, visual changes, nausea, vomiting, diarrhea, constipation, dizziness, abdominal pain, skin rash, fevers, chills, night sweats, weight loss, swollen lymph nodes, body aches, joint swelling, muscle aches, chest pain, shortness of breath, mood changes.   Objective Blood pressure 112/70, pulse 60, height 5\' 9"   (1.753 m), weight 149 lb (67.586 kg), SpO2 98 %.  General: No apparent distress alert and oriented x3 mood and affect normal, dressed appropriately.  HEENT: Pupils equal, extraocular movements intact  Respiratory: Patient's speak in full sentences and does not appear short of breath  Cardiovascular: No lower extremity edema, non tender, no erythema  Skin: Warm dry intact with no signs of infection or rash on extremities or on axial skeleton.  Abdomen: Soft nontender  Neuro: Cranial nerves II through XII are intact, neurovascularly intact in all extremities with 2+ DTRs and 2+ pulses.  Lymph: No lymphadenopathy of posterior or anterior cervical chain or axillae bilaterally.  Gait normal with good balance and coordination.  MSK:  Non tender with full range of motion and good stability and symmetric strength and tone of shoulders, elbows, wrist, hip, knee and ankles bilaterally.  Back Exam:  Inspection: Unremarkable Motion: Flexion 45 deg, Extension 45 deg, Side Bending to 45 deg bilaterally,  Rotation to 45 deg bilaterally  SLR laying: Negative  XSLR laying: Negative  Palpable tenderness: Tenderness over the ischial bursa area as well as the obturator muscle itself. FABER: negative. Sensory change: Gross sensation intact to all lumbar and sacral dermatomes.  Reflexes: 2+ at both patellar tendons, 2+ at achilles tendons, Babinski's downgoing.  Strength at foot  Plantar-flexion: 5/5 Dorsi-flexion: 5/5 Eversion: 5/5 Inversion: 5/5  Leg strength  Quad: 5/5 Hamstring: 4/5 Hip flexor: 4/5 Hip abductors: 4/5  Gait unremarkable.  MSK US performed of: Right hip This study was ordered, performed, and interpreted by Latasha Black D.O.  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    osteopathic findings  Cervical   C2 flexed rotated and side bent right  C4 flexed rotated and side bent left  T1 extended rotated and side bent left with elevated first rib  T5 extended rotated and side bent right L2 flexed rotated and side bent right Sacrum left on left   Impression and Recommendations:     This case required medical decision making of moderate complexity.

## 2015-10-08 ENCOUNTER — Ambulatory Visit
Admission: RE | Admit: 2015-10-08 | Discharge: 2015-10-08 | Disposition: A | Discharge status: Home / Self Care | Payer: 59 | Source: Ambulatory Visit

## 2015-10-08 DIAGNOSIS — Z1231 Encounter for screening mammogram for malignant neoplasm of breast: Secondary | ICD-10-CM

## 2015-10-25 ENCOUNTER — Encounter: Payer: Self-pay | Admitting: Family Medicine

## 2015-10-25 ENCOUNTER — Other Ambulatory Visit (INDEPENDENT_AMBULATORY_CARE_PROVIDER_SITE_OTHER): Payer: 59

## 2015-10-25 ENCOUNTER — Ambulatory Visit (INDEPENDENT_AMBULATORY_CARE_PROVIDER_SITE_OTHER): Payer: 59 | Admitting: Family Medicine

## 2015-10-25 VITALS — BP 114/74 | HR 48 | Ht 69.0 in | Wt 149.0 lb

## 2015-10-25 DIAGNOSIS — M79604 Pain in right leg: Secondary | ICD-10-CM

## 2015-10-25 DIAGNOSIS — G5781 Other specified mononeuropathies of right lower limb: Secondary | ICD-10-CM

## 2015-10-25 NOTE — Progress Notes (Signed)
Pre visit review using our clinic review tool, if applicable. No additional management support is needed unless otherwise documented below in the visit note. 

## 2015-10-25 NOTE — Patient Instructions (Addendum)
Good to see you We tried the injection Take next 48 hours fairly easy.  Ice will be needed in 6 hours.  Start running on day 3 but 50% duration and intensity increasing 10-15% a week.  See me again after christmas to make sure we have made progress otherwise will consider blocking the nerve.  Happy holidays!

## 2015-10-25 NOTE — Assessment & Plan Note (Signed)
Patient was given an injection today. We'll plan to break up of the scar tissue formation could be beneficial. Encourage patient to use the anti-inflammatories occasionally. Patient given more of an exercise percussion today that I think will be beneficial. We discussed the icing regimen. Patient will come back and see me again in 3 weeks for further evaluation.  Spent  25 minutes with patient face-to-face and had greater than 50% of counseling including as described above in assessment and plan.

## 2015-10-25 NOTE — Progress Notes (Signed)
[facility] [address], [place] [address] Phone: [phone] Subjective:     CC: Neck pain follow-up right buttock pain follow up  [id number] Latasha Black is a 39 y.o. female coming in with complaint of  Upper neck and back pain. Seems to my to time.  Patient is having a sided buttock pain. Appears to be more of an obturator nerve impingement. Attempted nitroglycerin the patient had headaches. Prednisone did help out significantly. Patient though is still having pain. He is going to be running a recreational 5K race on Saturday. Patient is willing to try something more aggressive if possible.  Past Medical History  Diagnosis Date  . Asthma   . Headache   . Allergy   . Migraine    No past surgical history on file. Social History  Substance Use Topics  . Smoking status: Never Smoker   . Smokeless tobacco: Never Used  . Alcohol Use: Yes   Allergies  Allergen Reactions  . Promethazine Hcl    No family history on file.      Past medical history, social, surgical and family history all reviewed in electronic medical record.   Review of Systems: No headache, visual changes, nausea, vomiting, diarrhea, constipation, dizziness, abdominal pain, skin rash, fevers, chills, night sweats, weight loss, swollen lymph nodes, body aches, joint swelling, muscle aches, chest pain, shortness of breath, mood changes.   Objective Blood pressure 114/74, pulse 48, height 5\' 9"  (1.753 m), weight 149 lb (67.586 kg), SpO2 99 %.  General: No apparent distress alert and oriented x3 mood and affect normal, dressed appropriately.  HEENT: Pupils equal, extraocular movements intact  Respiratory: Patient's speak in full sentences and does not appear short of breath  Cardiovascular: No lower extremity edema, non tender, no erythema  Skin: Warm dry intact with no signs of infection or rash on extremities or on axial skeleton.  Abdomen: Soft nontender    Neuro: Cranial nerves II through XII are intact, neurovascularly intact in all extremities with 2+ DTRs and 2+ pulses.  Lymph: No lymphadenopathy of posterior or anterior cervical chain or axillae bilaterally.  Gait normal with good balance and coordination.  MSK:  Non tender with full range of motion and good stability and symmetric strength and tone of shoulders, elbows, wrist, hip, knee and ankles bilaterally.  Back Exam:  Inspection: Unremarkable Motion: Flexion 45 deg, Extension 45 deg, Side Bending to 45 deg bilaterally,  Rotation to 45 deg bilaterally  SLR laying: Negative  XSLR laying: Negative  Palpable tenderness: Tenderness over the ischial bursa area as well as the obturator muscle itself. FABER: negative. Sensory change: Gross sensation intact to all lumbar and sacral dermatomes.  Reflexes: 2+ at both patellar tendons, 2+ at achilles tendons, Babinski's downgoing.  Strength at foot  Plantar-flexion: 5/5 Dorsi-flexion: 5/5 Eversion: 5/5 Inversion: 5/5  Leg strength  Quad: 5/5 Hamstring: 4/5 Hip flexor: 4/5 Hip abductors: 4/5  Gait unremarkable. No change from previous exam  Procedure: Real-time Ultrasound Guided Injection of obturator nerve block an ischial tuberosity Device: GE Logiq E  Ultrasound guided injection is preferred based studies that show increased duration, increased effect, greater accuracy, decreased procedural pain, increased response rate, and decreased cost with ultrasound guided versus blind injection.  Verbal informed consent obtained.  Time-out conducted.  Noted no overlying erythema, induration, or other signs of local infection.  Skin prepped in a sterile fashion.  Local anesthesia: Topical Ethyl chloride.  With sterile technique and under  real time ultrasound guidance:  With a 21-gauge 3 inch needle patient was injected with a total of 2 mL of 0.5% Marcaine and 0.5 mL of Kenalog 40 mg/dL. We injected into the scar tissue formation that had been over  the ischial tuberosity as well as given change pattern and 1 for the posterior branch of the operator nerve. Completed without difficulty  Pain significantly improved with some mild numbness of the foot. Advised to call if fevers/chills, erythema, induration, drainage, or persistent bleeding.  Images permanently stored and available for review in the ultrasound unit.  Impression: Technically successful ultrasound guided injection.    Impression and Recommendations:     This case required medical decision making of moderate complexity.

## 2015-11-25 ENCOUNTER — Ambulatory Visit (INDEPENDENT_AMBULATORY_CARE_PROVIDER_SITE_OTHER): Payer: 59 | Admitting: Family Medicine

## 2015-11-25 ENCOUNTER — Encounter: Payer: Self-pay | Admitting: Family Medicine

## 2015-11-25 VITALS — BP 118/60 | HR 55 | Ht 69.0 in | Wt 148.0 lb

## 2015-11-25 DIAGNOSIS — S32601A Unspecified fracture of right ischium, initial encounter for closed fracture: Secondary | ICD-10-CM | POA: Insufficient documentation

## 2015-11-25 DIAGNOSIS — G5781 Other specified mononeuropathies of right lower limb: Secondary | ICD-10-CM

## 2015-11-25 MED ORDER — ALENDRONATE SODIUM 70 MG PO TABS: 70.0000 mg | ORAL_TABLET | ORAL | Status: DC

## 2015-11-25 MED ORDER — VITAMIN D (ERGOCALCIFEROL) 1.25 MG (50000 UNIT) PO CAPS: 50000.0000 [IU] | ORAL_CAPSULE | ORAL | Status: DC

## 2015-11-25 NOTE — Patient Instructions (Addendum)
Always good to see you Ice is your friend Arloa Koh try once weekly vitamin D and fosamax We will do this for 8 weeks Run careful we will say.  Stop the gabapentin  See me again in 4 weeks.

## 2015-11-25 NOTE — Progress Notes (Signed)
Pre visit review using our clinic review tool, if applicable. No additional management support is needed unless otherwise documented below in the visit note. 

## 2015-11-25 NOTE — Assessment & Plan Note (Signed)
Some improvement after injection. Continue with plan

## 2015-11-25 NOTE — Assessment & Plan Note (Signed)
I do believe the patient does have more of a ischial cortical defect that likely is sitting very to a chronic stress reaction. Patient has not been any significant improvement over the course of time with certain things and I do feel that chain as a stress fracture could be beneficial. Patient will do Fosamax as well as high-dose vitamin D. Warned of potential side effects and patient is elected try these medications. We discussed that we would like her to continue to monitor her running and try to do more low impact exercises when possible. Patient is going to do an icing protocol. We will see her back again in 3-4 weeks for further evaluation and treatment.  Spent  25 minutes with patient face-to-face and had greater than 50% of counseling including as described above in assessment and plan.

## 2015-11-25 NOTE — Progress Notes (Signed)
[facility] [address] [place], [place] [address] Phone: [phone] Subjective:     CC: Neck pain follow-up right buttock pain follow up  [id number] Latasha Black is a 40 y.o. female coming in with complaint of  Upper neck and back pain. Seems to my to time.  Patient is having a sided buttock pain. Appears to be more of an obturator nerve impingement. Attempted nitroglycerin the patient had headaches. Prednisone did help out significantly. We didn't attempt to try to do an injection in the obturator nerve. States that she had complete resolution for some time but then it seemed to come back. States that it is approximately 30% better. Still has some discomfort in the area. Continues to running only seems to hurt when she is running. Would not consider it worsening the more stable overall.  Past Medical History  Diagnosis Date  . Asthma   . Headache   . Allergy   . Migraine    No past surgical history on file. Social History  Substance Use Topics  . Smoking status: Never Smoker   . Smokeless tobacco: Never Used  . Alcohol Use: Yes   Allergies  Allergen Reactions  . Promethazine Hcl    No family history on file.      Past medical history, social, surgical and family history all reviewed in electronic medical record.   Review of Systems: No headache, visual changes, nausea, vomiting, diarrhea, constipation, dizziness, abdominal pain, skin rash, fevers, chills, night sweats, weight loss, swollen lymph nodes, body aches, joint swelling, muscle aches, chest pain, shortness of breath, mood changes.   Objective Blood pressure 118/60, pulse 55, height 5\' 9"  (1.753 m), weight 148 lb (67.132 kg), SpO2 98 %.  General: No apparent distress alert and oriented x3 mood and affect normal, dressed appropriately.  HEENT: Pupils equal, extraocular movements intact  Respiratory: Patient's speak in full sentences and does not appear short of breath    Cardiovascular: No lower extremity edema, non tender, no erythema  Skin: Warm dry intact with no signs of infection or rash on extremities or on axial skeleton.  Abdomen: Soft nontender  Neuro: Cranial nerves II through XII are intact, neurovascularly intact in all extremities with 2+ DTRs and 2+ pulses.  Lymph: No lymphadenopathy of posterior or anterior cervical chain or axillae bilaterally.  Gait normal with good balance and coordination.  MSK:  Non tender with full range of motion and good stability and symmetric strength and tone of shoulders, elbows, wrist, hip, knee and ankles bilaterally.  Back Exam:  Inspection: Unremarkable Motion: Flexion 45 deg, Extension 45 deg, Side Bending to 45 deg bilaterally,  Rotation to 45 deg bilaterally  SLR laying: Negative  XSLR laying: Negative  Palpable tenderness: Continued tenderness over the gluteal area especially over the ischial area. FABER: negative. Sensory change: Gross sensation intact to all lumbar and sacral dermatomes.  Reflexes: 2+ at both patellar tendons, 2+ at achilles tendons, Babinski's downgoing.  Strength at foot  Plantar-flexion: 5/5 Dorsi-flexion: 5/5 Eversion: 5/5 Inversion: 5/5  Leg strength  Quad: 5/5 Hamstring: 4/5 Hip flexor: 4/5 Hip abductors: 4/5  Gait unremarkable. No change from previous exam  Limited muscular skeletal ultrasound was performed and interpreted by Hulan Saas, M  Limited ultrasound over the ischial area does show what appears to be a chronic type stress fracture now that all the hypoechoic changes of:Marland Kitchen Patient's obturator nerve was visualized and does appear proximal wing 30% smaller than what it was  previously with decreasing Doppler flow. 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     Impression and Recommendations:     This case required medical decision making of moderate  complexity.

## 2015-12-23 ENCOUNTER — Encounter: Payer: Self-pay | Admitting: Family Medicine

## 2015-12-23 ENCOUNTER — Ambulatory Visit (INDEPENDENT_AMBULATORY_CARE_PROVIDER_SITE_OTHER): Payer: 59 | Admitting: Family Medicine

## 2015-12-23 ENCOUNTER — Other Ambulatory Visit (INDEPENDENT_AMBULATORY_CARE_PROVIDER_SITE_OTHER): Payer: 59

## 2015-12-23 DIAGNOSIS — G5781 Other specified mononeuropathies of right lower limb: Secondary | ICD-10-CM

## 2015-12-23 DIAGNOSIS — M9903 Segmental and somatic dysfunction of lumbar region: Secondary | ICD-10-CM

## 2015-12-23 DIAGNOSIS — S32601A Unspecified fracture of right ischium, initial encounter for closed fracture: Secondary | ICD-10-CM

## 2015-12-23 DIAGNOSIS — M9904 Segmental and somatic dysfunction of sacral region: Secondary | ICD-10-CM | POA: Diagnosis not present

## 2015-12-23 DIAGNOSIS — M999 Biomechanical lesion, unspecified: Secondary | ICD-10-CM

## 2015-12-23 DIAGNOSIS — M9902 Segmental and somatic dysfunction of thoracic region: Secondary | ICD-10-CM | POA: Diagnosis not present

## 2015-12-23 DIAGNOSIS — M5416 Radiculopathy, lumbar region: Secondary | ICD-10-CM

## 2015-12-23 NOTE — Assessment & Plan Note (Signed)
Discussed with patient in great length. Patient working diagnosis more the ischial fracture. I do not see any significant findings today that corresponds with a healing stress fracture or it is completely healed at this time. Patient will finish her vitamin D and Fosamax. Patient is had this pain chronically for quite some time and every type of intervention we have tried has not made any significant improvement. Patient continues to have this pain. Patient is now having more pain with internal rotation of the hip on the posterior aspect. Patient sometimes does have the radicular symptoms going down the posterior aspect of the leg. I'm concern for possible nerve impingement in the back as well as possible intra-articular pathology of the hip. At this point with [place] not making any significant improvement I do feel that advance imaging is ordered. MRI is ordered for further evaluation. Depending on the MRIs then this would change medical management. This is starting to affect patient's activities including running and somewhat gives her discomfort even with sitting. Differential as we stated could be lumbar, pelvis, or hip. In addition of this possible operator hernia. MRI would be most valuable to evaluate all different possible pathologies.  Spent  25 minutes with patient face-to-face and had greater than 50% of counseling including as described above in assessment and plan.

## 2015-12-23 NOTE — Patient Instructions (Signed)
Verbal injections given

## 2015-12-23 NOTE — Assessment & Plan Note (Signed)
Decision today to treat with OMT was based on Physical Exam  After verbal consent patient was treated with HVLA, ME, FPR techniques in  Cervical, thoracic and rib an lumbar areas  Patient tolerated the procedure well with improvement in symptoms  Patient given exercises, stretches and lifestyle modifications  See medications in patient instructions if given  Patient will follow up in 3-4 weeks      

## 2015-12-23 NOTE — Progress Notes (Signed)
[facility] [address] [place], [place] [address] Phone: [phone] Subjective:     CC: Neck pain follow-up right buttock pain follow up and mild new onset of low back pain  [id number] Latasha Black is a 40 y.o. female coming in with complaint of  Upper neck and back pain. Seems to my to time.  Patient is having a sided buttock pain. Appears to be more of an obturator nerve impingement. Attempted nitroglycerin the patient had headaches. Prednisone did help out significantly. We did attempt to try to do an injection in the obturator nerve with very short-term results as well.. Patient then was started on vitamin D as well as Fosamax for potential stress reaction of the ischial area. States for the first 2 weeks seem to be improving but now is worsening. Now giving her more pain with internal rotation of her leg when she is sitting. Not as much as what the pressure. Still giving her pain with running. Starting affect daily activities. Having more radicular symptoms seemed to go down the posterior Continues to running only seems to hurt when she is running. Would not consider it worsening down somewhat. Given the radicular symptoms seems to be the more concerning part to her. Would state that may be associated with some mild back pain.  Past Medical History  Diagnosis Date  . Asthma   . Headache   . Allergy   . Migraine    No past surgical history on file. Social History  Substance Use Topics  . Smoking status: Never Smoker   . Smokeless tobacco: Never Used  . Alcohol Use: Yes   Allergies  Allergen Reactions  . Promethazine Hcl    No family history on file. no family history of rheumatoid arthritis      Past medical history, social, surgical and family history all reviewed in electronic medical record.   Review of Systems: No headache, visual changes, nausea, vomiting, diarrhea, constipation, dizziness, abdominal pain, skin rash, fevers,  chills, night sweats, weight loss, swollen lymph nodes, body aches, joint swelling, muscle aches, chest pain, shortness of breath, mood changes.   Objective There were no vitals taken for this visit.  General: No apparent distress alert and oriented x3 mood and affect normal, dressed appropriately.  HEENT: Pupils equal, extraocular movements intact  Respiratory: Patient's speak in full sentences and does not appear short of breath  Cardiovascular: No lower extremity edema, non tender, no erythema  Skin: Warm dry intact with no signs of infection or rash on extremities or on axial skeleton.  Abdomen: Soft nontender  Neuro: Cranial nerves II through XII are intact, neurovascularly intact in all extremities with 2+ DTRs and 2+ pulses.  Lymph: No lymphadenopathy of posterior or anterior cervical chain or axillae bilaterally.  Gait normal with good balance and coordination.  MSK:  Non tender with full range of motion and good stability and symmetric strength and tone of shoulders, elbows, wrist, hip, knee and ankles bilaterally.  Back Exam:  Inspection: Unremarkable Motion: Flexion 45 deg, Extension 45 deg, Side Bending to 45 deg bilaterally,  Rotation to 45 deg bilaterally  SLR laying: Mild positive straight leg test which is new XSLR laying: Negative  Palpable tenderness: Continued tenderness over the gluteal area especially over the ischial area. FABER: negative. Increasing discomfort with internal rotation of the hip but posteriorly is where the pain is. Sensory change: Gross sensation intact to all lumbar and sacral dermatomes.  Reflexes: 2+ at both  patellar tendons, 2+ at achilles tendons, Babinski's downgoing.  Strength at foot  Plantar-flexion: 5/5 Dorsi-flexion: 5/5 Eversion: 5/5 Inversion: 5/5  Leg strength  Quad: 5/5 Hamstring: 4/5 Hip flexor: 4/5 Hip abductors: 4/5  Gait unremarkable. No change from previous exam  Limited muscular skeletal ultrasound was performed and  interpreted by Hulan Saas, M  Limited ultrasound over the ischial area does show what appears area that was stress fracture seems to have improved significant limp. No even significant callus formation. Mild scarring noted. Images saved in internal hard drive Impression: Normal with significant decrease in callus formation over the ischial     Impression and Recommendations:     This case required medical decision making of moderate complexity.

## 2015-12-23 NOTE — Progress Notes (Signed)
Pre visit review using our clinic review tool, if applicable. No additional management support is needed unless otherwise documented below in the visit note. 

## 2015-12-23 NOTE — Assessment & Plan Note (Signed)
With patient having some of the numbness now more on the posterior aspect the leg less likely the obturator nerve but still has some of the internal discomfort. I do feel that further evaluation for any type of pelvic deformity that could be causing compression of this nerve or the sacroiliac joint is worth while for further evaluation. We discussed icing. Patient will continue with all other conservative therapy.

## 2015-12-28 NOTE — Addendum Note (Signed)
Addended by: Douglass Rivers T on: 12/28/2015 04:08 PM   Modules accepted: Orders

## 2016-01-06 ENCOUNTER — Ambulatory Visit
Admission: RE | Admit: 2016-01-06 | Discharge: 2016-01-06 | Disposition: A | Discharge status: Home / Self Care | Payer: 59 | Source: Ambulatory Visit | Attending: Family Medicine | Admitting: Family Medicine

## 2016-01-06 DIAGNOSIS — S32601A Unspecified fracture of right ischium, initial encounter for closed fracture: Secondary | ICD-10-CM

## 2016-01-06 DIAGNOSIS — M5416 Radiculopathy, lumbar region: Secondary | ICD-10-CM

## 2016-01-10 ENCOUNTER — Encounter: Payer: Self-pay | Admitting: Family Medicine

## 2016-01-15 ENCOUNTER — Other Ambulatory Visit: Payer: Self-pay | Admitting: Family Medicine

## 2016-01-17 NOTE — Telephone Encounter (Signed)
OK to refill

## 2016-03-20 ENCOUNTER — Ambulatory Visit (INDEPENDENT_AMBULATORY_CARE_PROVIDER_SITE_OTHER): Payer: 59

## 2016-03-20 ENCOUNTER — Ambulatory Visit (INDEPENDENT_AMBULATORY_CARE_PROVIDER_SITE_OTHER): Payer: 59 | Admitting: Orthopedic Surgery

## 2016-03-20 VITALS — BP 114/66 | Ht 69.0 in | Wt 151.0 lb

## 2016-03-20 DIAGNOSIS — M629 Disorder of muscle, unspecified: Secondary | ICD-10-CM | POA: Diagnosis not present

## 2016-03-20 DIAGNOSIS — S93401A Sprain of unspecified ligament of right ankle, initial encounter: Secondary | ICD-10-CM | POA: Diagnosis not present

## 2016-03-20 DIAGNOSIS — M25572 Pain in left ankle and joints of left foot: Secondary | ICD-10-CM | POA: Diagnosis not present

## 2016-03-20 DIAGNOSIS — S93699A Other sprain of unspecified foot, initial encounter: Secondary | ICD-10-CM

## 2016-03-20 NOTE — Progress Notes (Signed)
Chief Complaint  Patient presents with  . Ankle Pain    Left ankle pain, no injury.   HPI - 40 year old female here for evaluation of foot and ankle pain 1 day  The patient got off the couch last night but her left foot on the ground for immediate plantar foot pain and lateral ankle pain. Currently having 2 out of 10 pain on the plantar aspect of the foot and also over the lateral aspect of the ankle which is a dull ache. She did take ibuprofen last night it helped only minimally.  Review of Systems  Constitutional: Negative for fever and chills.  Musculoskeletal: Positive for joint pain.  Neurological: Negative for tingling.    Past Medical History  Diagnosis Date  . Asthma   . Headache   . Allergy   . Migraine    She reports never having surgery    Social History  Substance Use Topics  . Smoking status: Never Smoker   . Smokeless tobacco: Never Used  . Alcohol Use: Yes    Current outpatient prescriptions:  .  alendronate (FOSAMAX) 70 MG tablet, Take 1 tablet (70 mg total) by mouth every 7 (seven) days. Take with a full glass of water on an empty stomach., Disp: 8 tablet, Rfl: 0 .  Ibuprofen-Famotidine 800-26.6 MG TABS, TAKE 1 TABLET 3 TIMES DAILY AS NEEDED, Disp: 270 tablet, Rfl: 1 .  sertraline (ZOLOFT) 100 MG tablet, Take 100 mg by mouth daily., Disp: , Rfl: 3 .  Vitamin D, Ergocalciferol, (DRISDOL) 50000 units CAPS capsule, TAKE 1 CAPSULE (50,000 UNITS TOTAL) BY MOUTH EVERY 7 (SEVEN) DAYS., Disp: 8 capsule, Rfl: 0  BP 114/66 mmHg  Ht 5\' 9"  (1.753 m)  Wt 151 lb (68.493 kg)  BMI 22.29 kg/m2  LMP 03/03/2016 (Approximate)  Physical Exam  Constitutional: She is oriented to person, place, and time. She appears well-developed and well-nourished. No distress.  Cardiovascular: Normal rate and intact distal pulses.   Neurological: She is alert and oriented to person, place, and time. She has normal reflexes. She exhibits normal muscle tone. Coordination normal.  Skin: Skin  is warm and dry. No rash noted. She is not diaphoretic. No erythema. No pallor.  Psychiatric: She has a normal mood and affect. Her behavior is normal. Judgment and thought content normal.    Ortho Exam   left and right foot exam.   Right foot normal plantar grade foot with normal range of motion and normal stability with no swelling   Left foot there are some callus on the plantar aspect of the foot and great toe. She has tenderness in the anterolateral talofibular ligament and sinus tarsi as well as the medial plantar band of the plantar arch   Ankle range of motion is normal drawer test is stable muscle tone is excellent skins intact the pulses good and sensation is normal    ASSESSMENT: My personal interpretation of the images:the x-ray of the ankle shows a normal ankle   Diagnosis plantar arch sprain and anterior talofibular ligament sprain  Recommend short Cam Walker protected weightbearing with a cane in the right hand and ibuprofen for pain follow-up in 4 weeks

## 2016-04-19 ENCOUNTER — Ambulatory Visit: Payer: 59 | Admitting: Orthopedic Surgery

## 2016-04-19 ENCOUNTER — Ambulatory Visit (INDEPENDENT_AMBULATORY_CARE_PROVIDER_SITE_OTHER): Payer: 59 | Admitting: Orthopaedic Surgery

## 2016-04-19 ENCOUNTER — Encounter: Payer: Self-pay | Admitting: Orthopaedic Surgery

## 2016-04-19 VITALS — BP 104/71 | HR 63 | Temp 98.1°F | Ht 69.0 in | Wt 151.0 lb

## 2016-04-19 DIAGNOSIS — M25572 Pain in left ankle and joints of left foot: Secondary | ICD-10-CM | POA: Diagnosis not present

## 2016-04-19 DIAGNOSIS — M629 Disorder of muscle, unspecified: Secondary | ICD-10-CM

## 2016-04-19 DIAGNOSIS — S93699A Other sprain of unspecified foot, initial encounter: Secondary | ICD-10-CM

## 2016-04-19 NOTE — Progress Notes (Signed)
Patient [id number] [employer], female DOB:Aug 22, 1976, 40 y.o. [id number]  Chief Complaint  Patient presents with  . Follow-up    left foot    HPI  [employer] is a 40 y.o. female who is seen for evaluation of left plantar fascia problem.  She is a patient of Dr. Aline Brochure but he is running late in surgery and she asked to see me today.  She has done well with the left foot.  She has worn the CAM walker as directed.  She has been doing what Dr. Aline Brochure asked of her.  She would like to come out of the CAM walker.  She has no pain, no new trauma, no redness, no swelling.  HPI  Body mass index is 22.29 kg/(m^2).  ROS  Review of Systems  Constitutional: Negative for fever and chills.  Respiratory: Negative for cough and shortness of breath.   Cardiovascular: Negative for chest pain and leg swelling.  Musculoskeletal: Positive for arthralgias and gait problem.    Past Medical History  Diagnosis Date  . Asthma   . Headache   . Allergy   . Migraine     History reviewed. No pertinent past surgical history.  History reviewed. No pertinent family history.  Social History Social History  Substance Use Topics  . Smoking status: Never Smoker   . Smokeless tobacco: Never Used  . Alcohol Use: Yes    Allergies  Allergen Reactions  . Promethazine Hcl     Current Outpatient Prescriptions  Medication Sig Dispense Refill  . alendronate (FOSAMAX) 70 MG tablet Take 1 tablet (70 mg total) by mouth every 7 (seven) days. Take with a full glass of water on an empty stomach. 8 tablet 0  . Ibuprofen-Famotidine 800-26.6 MG TABS TAKE 1 TABLET 3 TIMES DAILY AS NEEDED 270 tablet 1  . sertraline (ZOLOFT) 100 MG tablet Take 100 mg by mouth daily.  3  . Vitamin D, Ergocalciferol, (DRISDOL) 50000 units CAPS capsule TAKE 1 CAPSULE (50,000 UNITS TOTAL) BY MOUTH EVERY 7 (SEVEN) DAYS. 8 capsule 0   No current facility-administered medications for this visit.     Physical Exam  Blood  pressure 104/71, pulse 63, temperature 98.1 F (36.7 C), height 5\' 9"  (1.753 m), weight 151 lb (68.493 kg), last menstrual period 03/03/2016.  Constitutional: overall normal hygiene, normal nutrition, well developed, normal grooming, normal body habitus. Assistive device:CAM walker left  Musculoskeletal: gait and station Limp left, muscle tone and strength are normal, no tremors or atrophy is present.  .  Neurological: coordination overall normal.  Deep tendon reflex/nerve stretch intact.  Sensation normal.  Cranial nerves II-XII intact.   Skin:   normal overall no scars, lesions, ulcers or rashes. No psoriasis.  Psychiatric: Alert and oriented x 3.  Recent memory intact, remote memory unclear.  Normal mood and affect. Well groomed.  Good eye contact.  Cardiovascular: overall no swelling, no varicosities, no edema bilaterally, normal temperatures of the legs and arms, no clubbing, cyanosis and good capillary refill.  Lymphatic: palpation is normal.  The right foot is normal  The left foot has some tenderness of the plantar side of the foot.  NV is intact. She has just slight tenderness to dorsal stretch.  The patient has been educated about the nature of the problem(s) and counseled on treatment options.  The patient appeared to understand what I have discussed and is in agreement with it.  Encounter Diagnoses  Name Primary?  . Plantar fascia rupture Yes  . Pain  in joint, ankle and foot, left     PLAN Call if any problems.  Precautions discussed.  Continue current medications.   Return to clinic PRn   I talked to her about going to the beach and walking on sand.  She should use beach shoes or sandals.  She appeared to understand the precautions.

## 2016-04-19 NOTE — Patient Instructions (Signed)
Instructions given to wear a supportive shoe such as a tennis shoe. Use ice and/or aspracreme with lidocaine.

## 2016-10-23 ENCOUNTER — Other Ambulatory Visit: Payer: Self-pay | Admitting: Obstetrics & Gynecology

## 2016-10-23 DIAGNOSIS — Z1231 Encounter for screening mammogram for malignant neoplasm of breast: Secondary | ICD-10-CM

## 2016-10-24 ENCOUNTER — Ambulatory Visit
Admission: RE | Admit: 2016-10-24 | Discharge: 2016-10-24 | Disposition: A | Discharge status: Home / Self Care | Payer: 59 | Source: Ambulatory Visit | Attending: Obstetrics & Gynecology | Admitting: Obstetrics & Gynecology

## 2016-10-24 DIAGNOSIS — Z1231 Encounter for screening mammogram for malignant neoplasm of breast: Secondary | ICD-10-CM

## 2017-01-01 DIAGNOSIS — Z6823 Body mass index (BMI) 23.0-23.9, adult: Secondary | ICD-10-CM | POA: Diagnosis not present

## 2017-01-01 DIAGNOSIS — Z01419 Encounter for gynecological examination (general) (routine) without abnormal findings: Secondary | ICD-10-CM | POA: Diagnosis not present

## 2017-01-01 DIAGNOSIS — N926 Irregular menstruation, unspecified: Secondary | ICD-10-CM | POA: Diagnosis not present

## 2017-02-06 DIAGNOSIS — M62838 Other muscle spasm: Secondary | ICD-10-CM | POA: Diagnosis not present

## 2017-02-06 DIAGNOSIS — M6281 Muscle weakness (generalized): Secondary | ICD-10-CM | POA: Diagnosis not present

## 2017-02-06 DIAGNOSIS — M6289 Other specified disorders of muscle: Secondary | ICD-10-CM | POA: Diagnosis not present

## 2017-02-20 DIAGNOSIS — M6281 Muscle weakness (generalized): Secondary | ICD-10-CM | POA: Diagnosis not present

## 2017-02-20 DIAGNOSIS — R102 Pelvic and perineal pain: Secondary | ICD-10-CM | POA: Diagnosis not present

## 2017-02-20 DIAGNOSIS — M62838 Other muscle spasm: Secondary | ICD-10-CM | POA: Diagnosis not present

## 2017-03-01 DIAGNOSIS — M6281 Muscle weakness (generalized): Secondary | ICD-10-CM | POA: Diagnosis not present

## 2017-03-01 DIAGNOSIS — K5900 Constipation, unspecified: Secondary | ICD-10-CM | POA: Diagnosis not present

## 2017-03-08 DIAGNOSIS — M6281 Muscle weakness (generalized): Secondary | ICD-10-CM | POA: Diagnosis not present

## 2017-03-08 DIAGNOSIS — K5900 Constipation, unspecified: Secondary | ICD-10-CM | POA: Diagnosis not present

## 2017-03-20 DIAGNOSIS — M6281 Muscle weakness (generalized): Secondary | ICD-10-CM | POA: Diagnosis not present

## 2017-03-20 DIAGNOSIS — K5900 Constipation, unspecified: Secondary | ICD-10-CM | POA: Diagnosis not present

## 2017-03-28 DIAGNOSIS — M6281 Muscle weakness (generalized): Secondary | ICD-10-CM | POA: Diagnosis not present

## 2017-03-28 DIAGNOSIS — K5900 Constipation, unspecified: Secondary | ICD-10-CM | POA: Diagnosis not present

## 2017-03-28 DIAGNOSIS — M62838 Other muscle spasm: Secondary | ICD-10-CM | POA: Diagnosis not present

## 2017-04-05 DIAGNOSIS — K5900 Constipation, unspecified: Secondary | ICD-10-CM | POA: Diagnosis not present

## 2017-04-05 DIAGNOSIS — M6281 Muscle weakness (generalized): Secondary | ICD-10-CM | POA: Diagnosis not present

## 2017-05-15 DIAGNOSIS — M62838 Other muscle spasm: Secondary | ICD-10-CM | POA: Diagnosis not present

## 2017-05-15 DIAGNOSIS — M6281 Muscle weakness (generalized): Secondary | ICD-10-CM | POA: Diagnosis not present

## 2017-06-05 DIAGNOSIS — M6281 Muscle weakness (generalized): Secondary | ICD-10-CM | POA: Diagnosis not present

## 2017-06-05 DIAGNOSIS — M62838 Other muscle spasm: Secondary | ICD-10-CM | POA: Diagnosis not present

## 2017-06-28 DIAGNOSIS — J309 Allergic rhinitis, unspecified: Secondary | ICD-10-CM | POA: Diagnosis not present

## 2017-06-28 DIAGNOSIS — J45990 Exercise induced bronchospasm: Secondary | ICD-10-CM | POA: Diagnosis not present

## 2017-09-24 ENCOUNTER — Other Ambulatory Visit: Payer: Self-pay | Admitting: Physician Assistant

## 2017-09-24 DIAGNOSIS — Z1231 Encounter for screening mammogram for malignant neoplasm of breast: Secondary | ICD-10-CM

## 2017-10-25 ENCOUNTER — Ambulatory Visit
Admission: RE | Admit: 2017-10-25 | Discharge: 2017-10-25 | Disposition: A | Discharge status: Home / Self Care | Payer: 59 | Source: Ambulatory Visit | Attending: Physician Assistant | Admitting: Physician Assistant

## 2017-10-25 DIAGNOSIS — Z1231 Encounter for screening mammogram for malignant neoplasm of breast: Secondary | ICD-10-CM

## 2017-12-17 ENCOUNTER — Other Ambulatory Visit (HOSPITAL_COMMUNITY)
Admission: RE | Admit: 2017-12-17 | Discharge: 2017-12-17 | Disposition: A | Discharge status: Home / Self Care | Payer: 59 | Source: Ambulatory Visit | Attending: Dermatology | Admitting: Dermatology

## 2017-12-17 DIAGNOSIS — Z79899 Other long term (current) drug therapy: Secondary | ICD-10-CM | POA: Diagnosis present

## 2017-12-18 LAB — HIV ANTIBODY (ROUTINE TESTING W REFLEX): HIV Screen 4th Generation wRfx: NONREACTIVE

## 2017-12-18 LAB — HEPATITIS B CORE ANTIBODY, IGM: HEP B C IGM: NEGATIVE

## 2017-12-18 LAB — HEPATITIS B SURFACE ANTIBODY, QUANTITATIVE: HEPATITIS B-POST: 65.1 m[IU]/mL

## 2017-12-18 LAB — HEPATITIS B SURFACE ANTIGEN: Hepatitis B Surface Ag: NEGATIVE

## 2017-12-19 LAB — HCV RNA QUANT: HCV QUANT: NOT DETECTED [IU]/mL (ref >50)

## 2018-01-05 ENCOUNTER — Encounter (HOSPITAL_COMMUNITY): Payer: Self-pay

## 2018-01-05 ENCOUNTER — Emergency Department (HOSPITAL_COMMUNITY)
Admission: EM | Admit: 2018-01-05 | Discharge: 2018-01-05 | Disposition: A | Discharge status: Home / Self Care | Payer: 59 | Attending: Emergency Medicine | Admitting: Emergency Medicine

## 2018-01-05 DIAGNOSIS — Y92410 Unspecified street and highway as the place of occurrence of the external cause: Secondary | ICD-10-CM | POA: Diagnosis not present

## 2018-01-05 DIAGNOSIS — Y9389 Activity, other specified: Secondary | ICD-10-CM | POA: Diagnosis not present

## 2018-01-05 DIAGNOSIS — J45909 Unspecified asthma, uncomplicated: Secondary | ICD-10-CM | POA: Insufficient documentation

## 2018-01-05 DIAGNOSIS — S6992XA Unspecified injury of left wrist, hand and finger(s), initial encounter: Secondary | ICD-10-CM | POA: Diagnosis present

## 2018-01-05 DIAGNOSIS — Y999 Unspecified external cause status: Secondary | ICD-10-CM | POA: Diagnosis not present

## 2018-01-05 DIAGNOSIS — Z79899 Other long term (current) drug therapy: Secondary | ICD-10-CM | POA: Insufficient documentation

## 2018-01-05 DIAGNOSIS — S60812A Abrasion of left wrist, initial encounter: Secondary | ICD-10-CM | POA: Insufficient documentation

## 2018-01-05 MED ORDER — IBUPROFEN 800 MG PO TABS
800.0000 mg | ORAL_TABLET | Freq: Once | ORAL | Status: DC
  Filled 2018-01-05: qty 1
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
## 2018-01-05 NOTE — ED Notes (Signed)
Awaiting DC paperwork 
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
## 2018-07-01 DIAGNOSIS — D485 Neoplasm of uncertain behavior of skin: Secondary | ICD-10-CM | POA: Diagnosis not present

## 2018-07-01 DIAGNOSIS — D225 Melanocytic nevi of trunk: Secondary | ICD-10-CM | POA: Diagnosis not present

## 2018-08-22 DIAGNOSIS — Z0000 Encounter for general adult medical examination without abnormal findings: Secondary | ICD-10-CM | POA: Diagnosis not present

## 2018-08-22 DIAGNOSIS — J45990 Exercise induced bronchospasm: Secondary | ICD-10-CM | POA: Diagnosis not present

## 2018-08-22 DIAGNOSIS — Z23 Encounter for immunization: Secondary | ICD-10-CM | POA: Diagnosis not present

## 2018-08-22 DIAGNOSIS — Z13220 Encounter for screening for lipoid disorders: Secondary | ICD-10-CM | POA: Diagnosis not present

## 2018-08-22 DIAGNOSIS — J309 Allergic rhinitis, unspecified: Secondary | ICD-10-CM | POA: Diagnosis not present

## 2018-08-26 DIAGNOSIS — Z6824 Body mass index (BMI) 24.0-24.9, adult: Secondary | ICD-10-CM | POA: Diagnosis not present

## 2018-08-26 DIAGNOSIS — Z01419 Encounter for gynecological examination (general) (routine) without abnormal findings: Secondary | ICD-10-CM | POA: Diagnosis not present

## 2018-09-18 ENCOUNTER — Other Ambulatory Visit: Payer: Self-pay | Admitting: Physician Assistant

## 2018-09-18 DIAGNOSIS — Z1231 Encounter for screening mammogram for malignant neoplasm of breast: Secondary | ICD-10-CM

## 2018-10-24 DIAGNOSIS — Z23 Encounter for immunization: Secondary | ICD-10-CM | POA: Diagnosis not present

## 2018-10-31 ENCOUNTER — Ambulatory Visit
Admission: RE | Admit: 2018-10-31 | Discharge: 2018-10-31 | Disposition: A | Discharge status: Home / Self Care | Payer: 59 | Source: Ambulatory Visit | Attending: Physician Assistant | Admitting: Physician Assistant

## 2018-10-31 DIAGNOSIS — Z1231 Encounter for screening mammogram for malignant neoplasm of breast: Secondary | ICD-10-CM

## 2018-11-14 DIAGNOSIS — M545 Low back pain: Secondary | ICD-10-CM | POA: Diagnosis not present

## 2018-11-25 DIAGNOSIS — M545 Low back pain: Secondary | ICD-10-CM | POA: Diagnosis not present

## 2018-12-02 DIAGNOSIS — M545 Low back pain: Secondary | ICD-10-CM | POA: Diagnosis not present

## 2018-12-23 DIAGNOSIS — M545 Low back pain: Secondary | ICD-10-CM | POA: Diagnosis not present

## 2019-02-27 DIAGNOSIS — Z23 Encounter for immunization: Secondary | ICD-10-CM | POA: Diagnosis not present

## 2019-10-05 IMAGING — MG 2D DIGITAL SCREENING BILATERAL MAMMOGRAM WITH CAD AND ADJUNCT TO
9 of 12 series · 9 of 28 positions shown · non-contrast
Comparison: Previous exam(s).

CLINICAL DATA: Screening.

EXAM:
2D DIGITAL SCREENING BILATERAL MAMMOGRAM WITH CAD AND ADJUNCT TOMO

[L CC synth-2D]
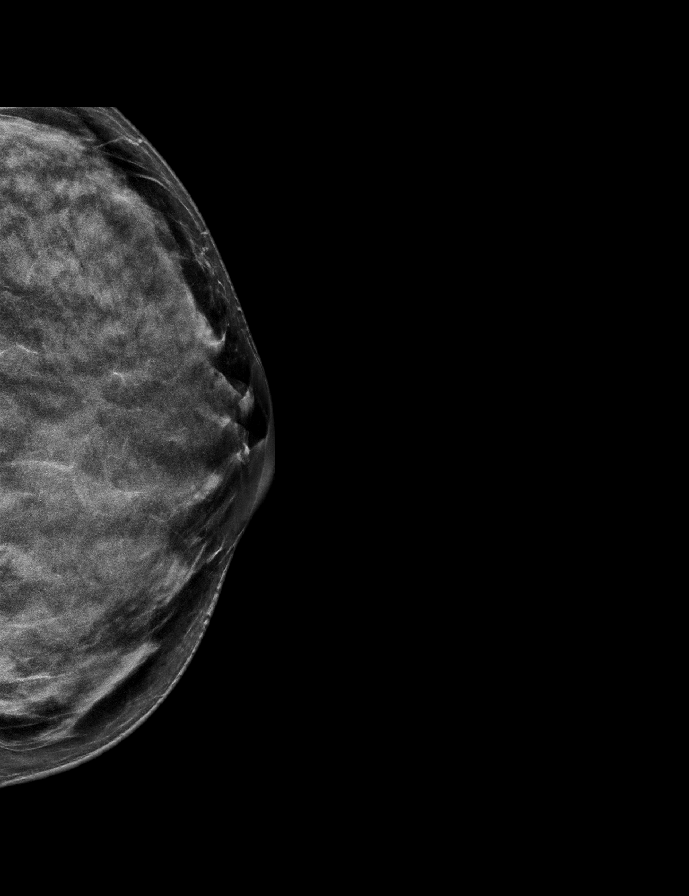

[L MLO]
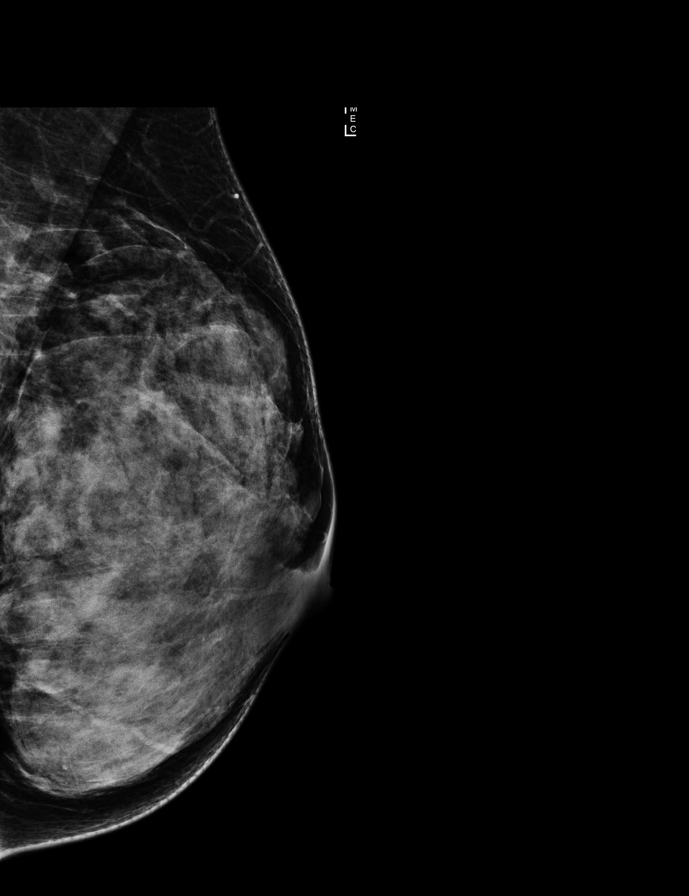

[R MLO synth-2D]
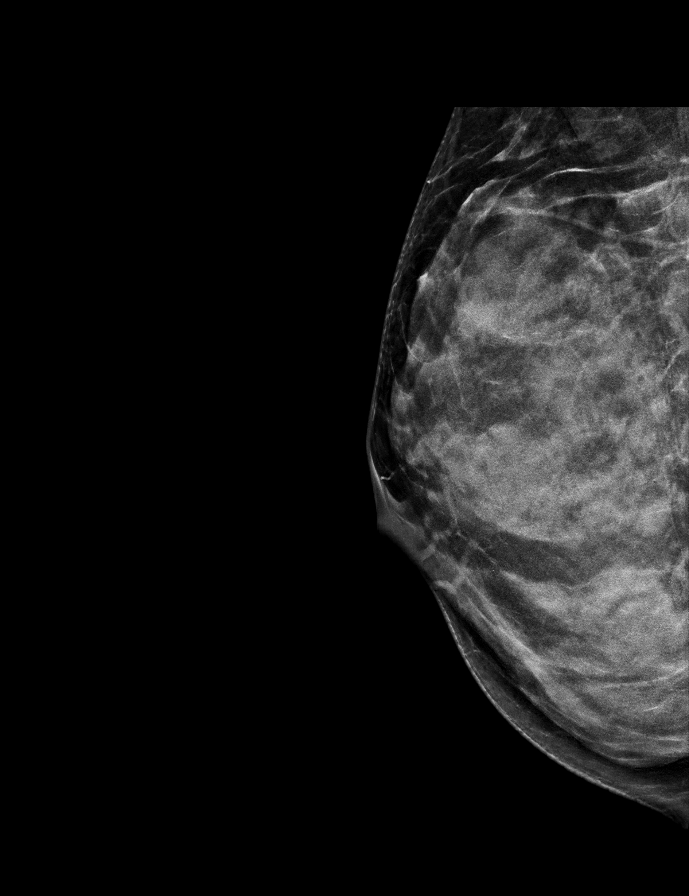

[R CC]
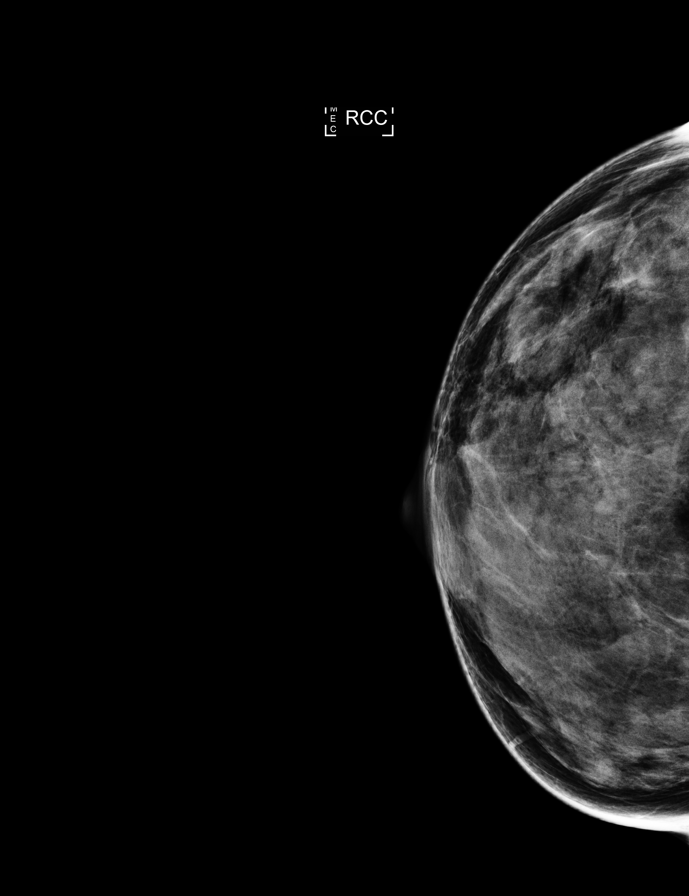

[R MLO]
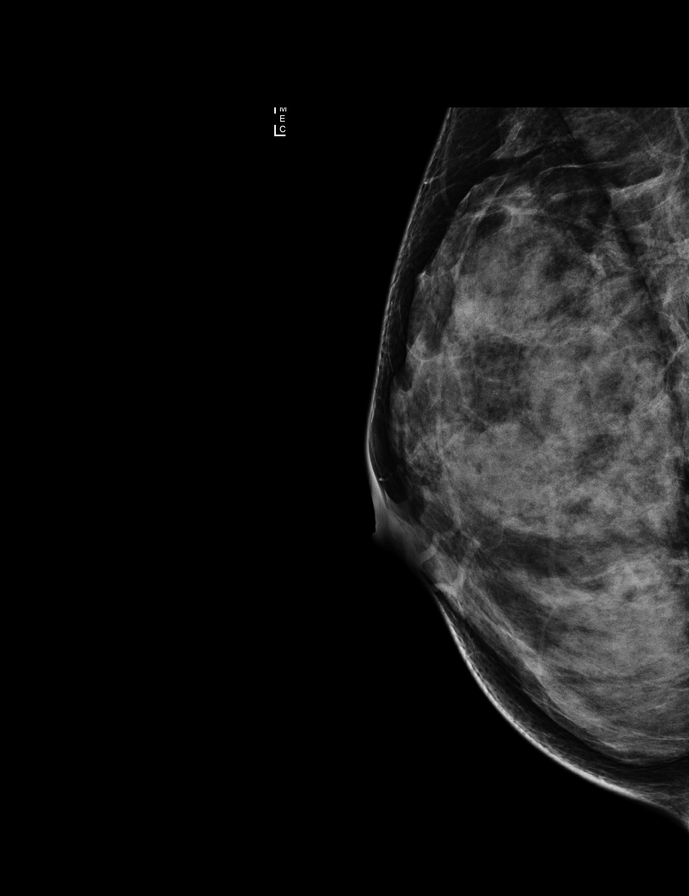

[L CC]
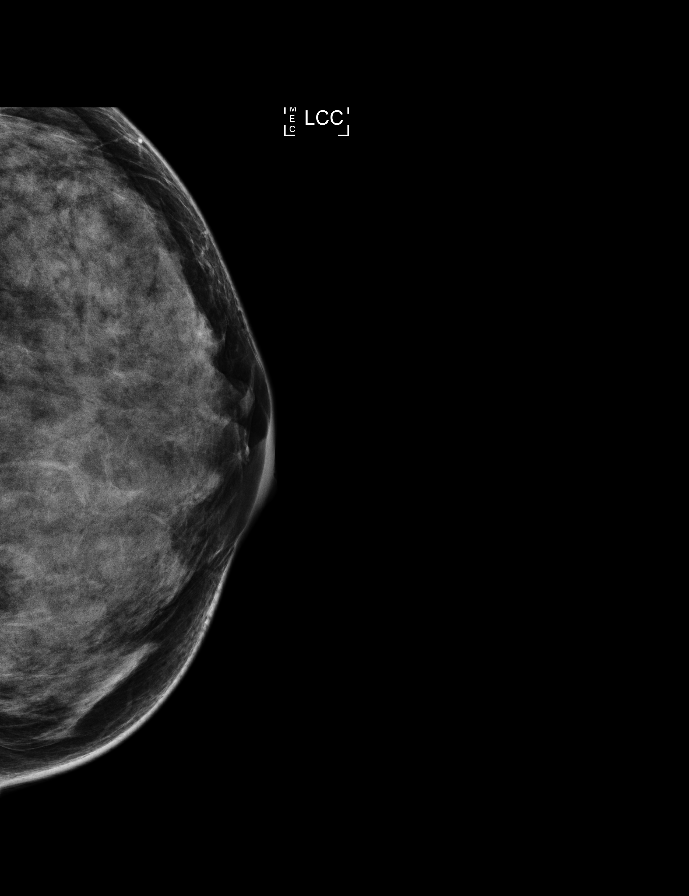

[L MLO synth-2D]
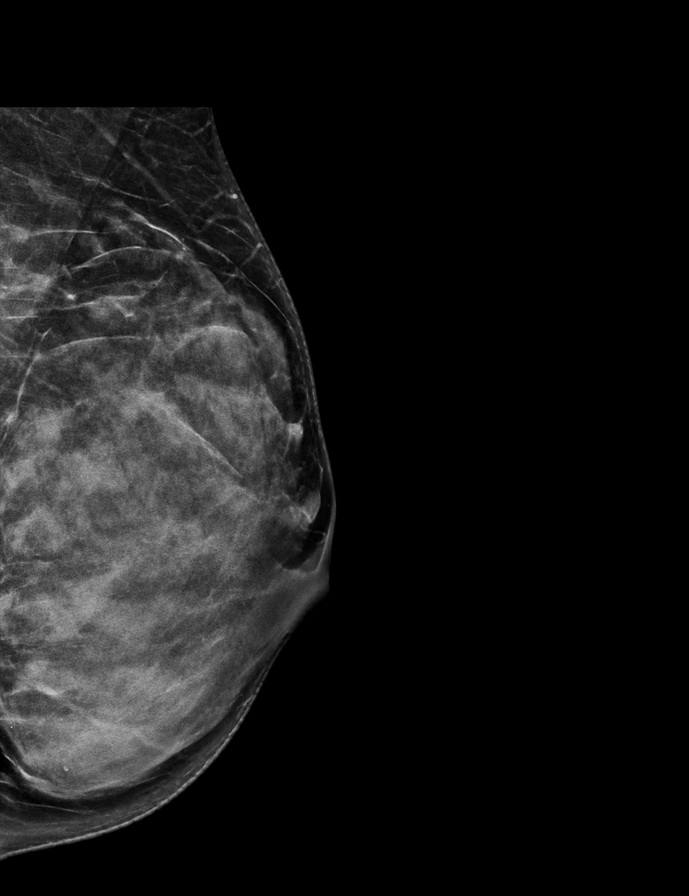

[R CC synth-2D]
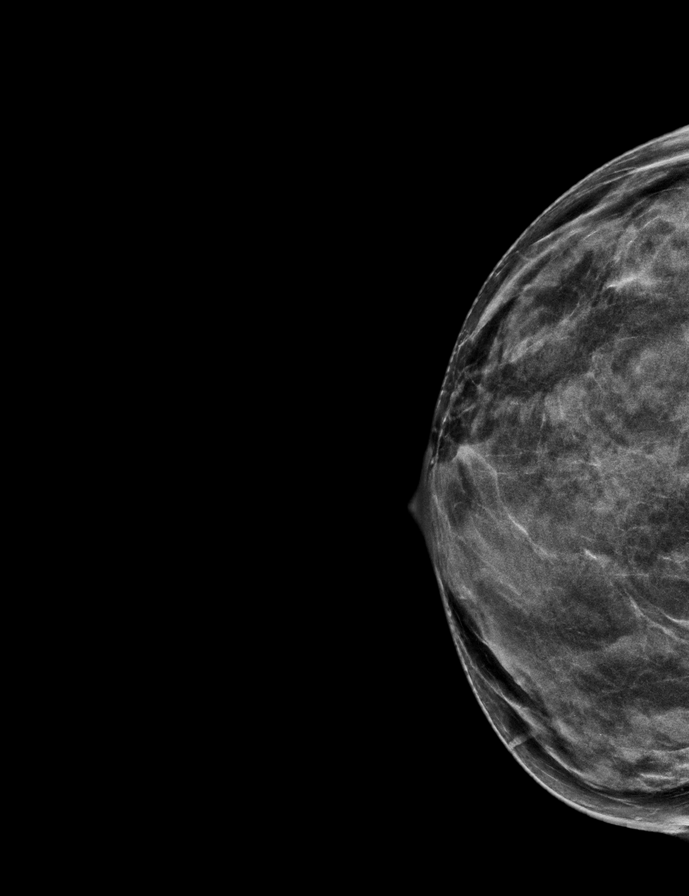

[R MLO tomo · tomo slice 27/54.0]
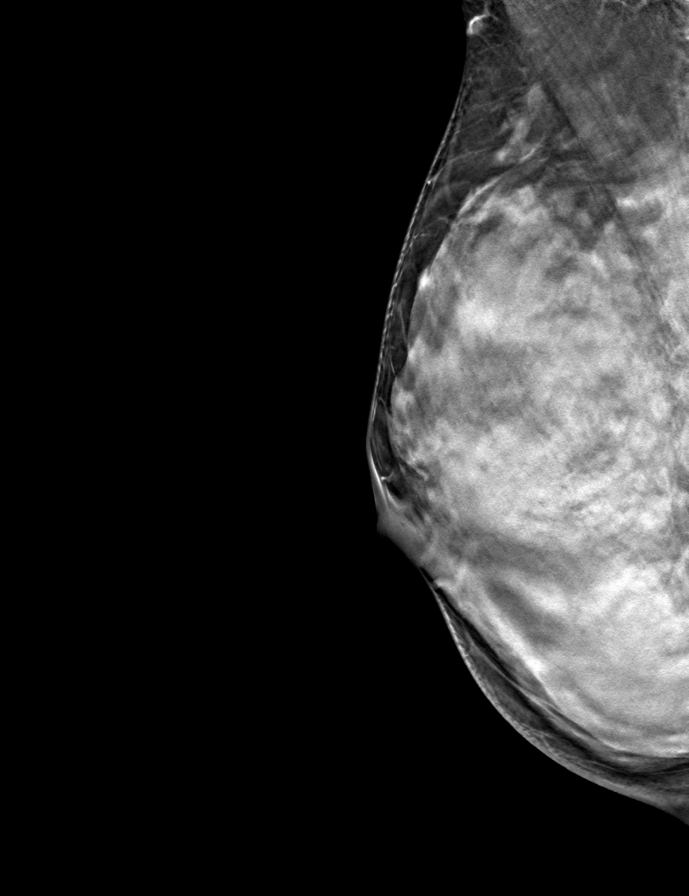

[9 of 28 positions shown; findings below may reference images not displayed]

ACR Breast Density Category d: The breast tissue is extremely dense,
which lowers the sensitivity of mammography.
FINDINGS: There are no findings suspicious for malignancy. Images were
processed with CAD.
IMPRESSION: No mammographic evidence of malignancy. A result letter of this
screening mammogram will be mailed directly to the patient.

RECOMMENDATION:
Screening mammogram in one year. (Code:US-D-RZ7)

BI-RADS CATEGORY  1: Negative.

## 2019-10-07 ENCOUNTER — Other Ambulatory Visit: Payer: Self-pay | Admitting: Physician Assistant

## 2019-10-07 DIAGNOSIS — Z1231 Encounter for screening mammogram for malignant neoplasm of breast: Secondary | ICD-10-CM

## 2019-12-11 ENCOUNTER — Other Ambulatory Visit: Payer: Self-pay

## 2019-12-11 ENCOUNTER — Ambulatory Visit
Admission: RE | Admit: 2019-12-11 | Discharge: 2019-12-11 | Disposition: A | Discharge status: Home / Self Care | Payer: 59 | Source: Ambulatory Visit | Attending: Physician Assistant | Admitting: Physician Assistant

## 2019-12-11 DIAGNOSIS — Z1231 Encounter for screening mammogram for malignant neoplasm of breast: Secondary | ICD-10-CM

## 2020-09-27 NOTE — Progress Notes (Signed)
CARDIOLOGY CONSULT NOTE       Patient ID: Latasha Black MRN: [id number] DOB/AGE: Mar 28, 1976 44 y.o.  Admit date: (Not on file) Referring Physician: [place] Primary Physician: Lennie Odor, [place] Primary Cardiologist: New Reason for Consultation: Chest Pain / Family history of CAD  Active Problems:   * No active hospital problems. *   HPI:  44 y.o. referred by Dr Barrie Folk for family history of CAD and atypical chest pain She has anxiety and exercise induced asthma at times Father had CABG in his 12's TC is 15 with LDL of 145 She has atypical pain likely related to stress. Pain is non exertional radiates to back lasts seconds to minutes and is very intermittent. She does not particularly want to be on statin  Not very active outside of work Has an 47 yo at [employer] and 46 yo at [place]. Husband does insurance work  ROS All other systems reviewed and negative except as noted above  Past Medical History:  Diagnosis Date  . Allergy   . Asthma   . Headache   . Migraine     Family History  Problem Relation Age of Onset  . Breast cancer Neg Hx     Social History   Socioeconomic History  . Marital status: Married    Spouse name: Not on file  . Number of children: Not on file  . Years of education: Not on file  . Highest education level: Not on file  Occupational History  . Not on file  Tobacco Use  . Smoking status: Never Smoker  . Smokeless tobacco: Never Used  Substance and Sexual Activity  . Alcohol use: Yes    Comment: occ  . Drug use: No  . Sexual activity: Not on file  Other Topics Concern  . Not on file  Social History Narrative  . Not on file   Social Determinants of Health   Financial Resource Strain:   . Difficulty of Paying Living Expenses: Not on file  Food Insecurity:   . Worried About Charity fundraiser in the Last Year: Not on file  . Ran Out of Food in the Last Year: Not on file  Transportation Needs:   . Lack of Transportation  (Medical): Not on file  . Lack of Transportation (Non-Medical): Not on file  Physical Activity:   . Days of Exercise per Week: Not on file  . Minutes of Exercise per Session: Not on file  Stress:   . Feeling of Stress : Not on file  Social Connections:   . Frequency of Communication with Friends and Family: Not on file  . Frequency of Social Gatherings with Friends and Family: Not on file  . Attends Religious Services: Not on file  . Active Member of Clubs or Organizations: Not on file  . Attends Archivist Meetings: Not on file  . Marital Status: Not on file  Intimate Partner Violence:   . Fear of Current or Ex-Partner: Not on file  . Emotionally Abused: Not on file  . Physically Abused: Not on file  . Sexually Abused: Not on file    No past surgical history on file.    Current Outpatient Medications:  .  alendronate (FOSAMAX) 70 MG tablet, Take 1 tablet (70 mg total) by mouth every 7 (seven) days. Take with a full glass of water on an empty stomach., Disp: 8 tablet, Rfl: 0 .  Ibuprofen-Famotidine 800-26.6 MG TABS, TAKE 1 TABLET 3 TIMES DAILY AS NEEDED,  Disp: 270 tablet, Rfl: 1 .  sertraline (ZOLOFT) 100 MG tablet, Take 100 mg by mouth daily., Disp: , Rfl: 3 .  Vitamin D, Ergocalciferol, (DRISDOL) 50000 units CAPS capsule, TAKE 1 CAPSULE (50,000 UNITS TOTAL) BY MOUTH EVERY 7 (SEVEN) DAYS., Disp: 8 capsule, Rfl: 0    Physical Exam: There were no vitals taken for this visit.    Affect appropriate Healthy:  appears stated age 58: normal Neck supple with no adenopathy JVP normal no bruits no thyromegaly Lungs clear with no wheezing and good diaphragmatic motion Heart:  S1/S2 no murmur, no rub, gallop or click PMI normal Abdomen: benighn, BS positve, no tenderness, no AAA no bruit.  No HSM or HJR Distal pulses intact with no bruits No edema Neuro non-focal Skin warm and dry No muscular weakness   Labs:  No results found for: WBC, HGB, HCT, MCV, PLT No  results for input(s): NA, K, CL, CO2, BUN, CREATININE, CALCIUM, PROT, BILITOT, ALKPHOS, ALT, AST, GLUCOSE in the last 168 hours.  Invalid input(s): LABALBU No results found for: CKTOTAL, CKMB, CKMBINDEX, TROPONINI No results found for: CHOL No results found for: HDL No results found for: LDLCALC No results found for: TRIG No results found for: CHOLHDL No results found for: LDLDIRECT    Radiology: No results found.  EKG: SR rate 53 normal    ASSESSMENT AND PLAN:   1. Chest Pain atypical f/u ETT with normal ECG 2. Family History of CAD:  Coronary calcium score recommended to risk stratify and further discuss  need to Rx lipids   F/U ETT and coronary calcium score PRN f/u if low risk/normal   Signed: Jenkins Rouge 09/27/2020, 11:21 AM

## 2020-09-28 ENCOUNTER — Encounter: Payer: Self-pay | Admitting: Cardiovascular Disease

## 2020-09-28 ENCOUNTER — Ambulatory Visit: Payer: 59 | Admitting: Cardiovascular Disease

## 2020-09-28 ENCOUNTER — Other Ambulatory Visit: Payer: Self-pay

## 2020-09-28 VITALS — BP 118/86 | HR 53 | Ht 69.0 in | Wt 169.6 lb

## 2020-09-28 DIAGNOSIS — Z8249 Family history of ischemic heart disease and other diseases of the circulatory system: Secondary | ICD-10-CM

## 2020-09-28 DIAGNOSIS — R079 Chest pain, unspecified: Secondary | ICD-10-CM | POA: Diagnosis not present

## 2020-09-28 NOTE — Patient Instructions (Signed)
Medication Instructions:  *If you need a refill on your cardiac medications before your next appointment, please call your pharmacy*  Lab Work: If you have labs (blood work) drawn today and your tests are completely normal, you will receive your results only by: Marland Kitchen MyChart Message (if you have MyChart) OR . A paper copy in the mail If you have any lab test that is abnormal or we need to change your treatment, we will call you to review the results.  Testing/Procedures: Your physician has requested that you have an exercise tolerance test. For further information please visit [email or web address]. Please also follow instruction sheet, as given.  Cardiac CT scanning for calcium score, (CAT scanning), is a noninvasive, special x-ray that produces cross-sectional images of the body using x-rays and a computer. CT scans help physicians diagnose and treat medical conditions. For some CT exams, a contrast material is used to enhance visibility in the area of the body being studied. CT scans provide greater clarity and reveal more details than regular x-ray exams.   Follow-Up: At [facility], you and your health needs are our priority.  As part of our continuing mission to provide you with exceptional heart care, we have created designated Provider Care Teams.  These Care Teams include your primary Cardiologist (physician) and Advanced Practice Providers (APPs -  Physician Assistants and Nurse Practitioners) who all work together to provide you with the care you need, when you need it.  We recommend signing up for the patient portal called "MyChart".  Sign up information is provided on this After Visit Summary.  MyChart is used to connect with patients for Virtual Visits (Telemedicine).  Patients are able to view lab/test results, encounter notes, upcoming appointments, etc.  Non-urgent messages can be sent to your provider as well.   To learn more about what you can do with MyChart, go to  [email or web address].    Your next appointment:   As needed if test results are low risk/normal.  The format for your next appointment:   In Person  Provider:   You may see Dr. Johnsie Cancel or one of the following Advanced Practice Providers on your designated Care Team:    Truitt Merle, NP  Cecilie Kicks, NP  Kathyrn Drown, NP

## 2020-10-29 ENCOUNTER — Inpatient Hospital Stay (HOSPITAL_COMMUNITY)
Admission: RE | Admit: 2020-10-29 | Discharge: 2020-10-29 | Disposition: A | Discharge status: Home / Self Care | Payer: 59 | Source: Ambulatory Visit

## 2020-10-29 NOTE — Progress Notes (Signed)
Patient states that she was not informed by her provider's office that she needed to quarantine after being tested for covid for her procedure, therefore she will call the office and reschedule her procedure.

## 2020-11-02 ENCOUNTER — Inpatient Hospital Stay: Admission: RE | Admit: 2020-11-02 | Payer: 59 | Source: Ambulatory Visit

## 2020-11-30 ENCOUNTER — Other Ambulatory Visit (HOSPITAL_COMMUNITY)
Admission: RE | Admit: 2020-11-30 | Discharge: 2020-11-30 | Disposition: A | Discharge status: Home / Self Care | Payer: 59 | Source: Ambulatory Visit | Attending: Cardiovascular Disease | Admitting: Cardiovascular Disease

## 2020-11-30 DIAGNOSIS — Z20822 Contact with and (suspected) exposure to covid-19: Secondary | ICD-10-CM | POA: Insufficient documentation

## 2020-11-30 DIAGNOSIS — Z01812 Encounter for preprocedural laboratory examination: Secondary | ICD-10-CM | POA: Diagnosis not present

## 2020-11-30 LAB — SARS CORONAVIRUS 2 (TAT 6-24 HRS): SARS Coronavirus 2: NEGATIVE

## 2020-12-01 ENCOUNTER — Ambulatory Visit (INDEPENDENT_AMBULATORY_CARE_PROVIDER_SITE_OTHER): Payer: 59

## 2020-12-01 ENCOUNTER — Other Ambulatory Visit: Payer: Self-pay

## 2020-12-01 ENCOUNTER — Ambulatory Visit (INDEPENDENT_AMBULATORY_CARE_PROVIDER_SITE_OTHER)
Admission: RE | Admit: 2020-12-01 | Discharge: 2020-12-01 | Disposition: A | Discharge status: Home / Self Care | Payer: Self-pay | Source: Ambulatory Visit | Attending: Cardiovascular Disease | Admitting: Cardiovascular Disease

## 2020-12-01 DIAGNOSIS — R079 Chest pain, unspecified: Secondary | ICD-10-CM

## 2020-12-01 DIAGNOSIS — Z8249 Family history of ischemic heart disease and other diseases of the circulatory system: Secondary | ICD-10-CM | POA: Diagnosis not present

## 2020-12-01 LAB — EXERCISE TOLERANCE TEST
Estimated workload: 13.7 METS
Exercise duration (min): 12 min
Exercise duration (sec): 0 s
MPHR: 174 {beats}/min
Peak HR: 166 {beats}/min
Percent HR: 95 %
RPE: 18
Rest HR: 62 {beats}/min

## 2020-12-27 ENCOUNTER — Other Ambulatory Visit: Payer: Self-pay | Admitting: Physician Assistant

## 2020-12-27 DIAGNOSIS — Z1231 Encounter for screening mammogram for malignant neoplasm of breast: Secondary | ICD-10-CM

## 2021-02-14 ENCOUNTER — Ambulatory Visit
Admission: RE | Admit: 2021-02-14 | Discharge: 2021-02-14 | Disposition: A | Discharge status: Home / Self Care | Payer: 59 | Source: Ambulatory Visit | Attending: Physician Assistant | Admitting: Physician Assistant

## 2021-02-14 ENCOUNTER — Other Ambulatory Visit: Payer: Self-pay

## 2021-02-14 DIAGNOSIS — Z1231 Encounter for screening mammogram for malignant neoplasm of breast: Secondary | ICD-10-CM

## 2022-01-17 ENCOUNTER — Other Ambulatory Visit: Payer: Self-pay | Admitting: Physician Assistant

## 2022-01-17 DIAGNOSIS — Z1231 Encounter for screening mammogram for malignant neoplasm of breast: Secondary | ICD-10-CM

## 2022-02-16 ENCOUNTER — Ambulatory Visit
Admission: RE | Admit: 2022-02-16 | Discharge: 2022-02-16 | Disposition: A | Discharge status: Home / Self Care | Payer: 59 | Source: Ambulatory Visit | Attending: Physician Assistant | Admitting: Physician Assistant

## 2022-02-16 ENCOUNTER — Other Ambulatory Visit: Payer: Self-pay | Admitting: Obstetrics & Gynecology

## 2022-02-16 DIAGNOSIS — Z1231 Encounter for screening mammogram for malignant neoplasm of breast: Secondary | ICD-10-CM

## 2022-02-23 ENCOUNTER — Ambulatory Visit: Payer: 59

## 2022-02-23 ENCOUNTER — Ambulatory Visit: Payer: 59 | Admitting: Orthopedic Surgery

## 2022-02-23 VITALS — BP 118/74 | HR 60 | Ht 68.0 in | Wt 151.0 lb

## 2022-02-23 DIAGNOSIS — M25532 Pain in left wrist: Secondary | ICD-10-CM

## 2022-02-23 DIAGNOSIS — R2232 Localized swelling, mass and lump, left upper limb: Secondary | ICD-10-CM

## 2022-02-23 NOTE — Patient Instructions (Signed)
You have been referred to see the hand specialist in [place], Dr. Tempie Donning. ? ?That number is [phone] if you want to call and schedule your appt.  ?

## 2022-02-23 NOTE — Progress Notes (Signed)
NEW PROBLEM//OFFICE VISIT ? ? ?Chief Complaint  ?Patient presents with  ? Wrist Pain  ?  Left wrist pain ?  ? ?43 female left wrist pain right hand overall healthy presents with 3 to 4-week history of left sided ulnar wrist pain swelling over the pisiform.  Some aching in the left shoulder.  Denies any trauma. ? ? ? ? ? ?ROS: Complete system review is negative ?ROS ? ? ?BP 118/74   Pulse 60   Ht '5\' 8"'$  (1.727 m)   Wt 151 lb (68.5 kg)   BMI 22.96 kg/m?  ? ?Body mass index is 22.96 kg/m?. ? ?General appearance: Well-developed well-nourished no gross deformities ? ?Cardiovascular normal pulse and perfusion normal color without edema ? ?Neurologically no sensation loss or deficits or pathologic reflexes ? ?Psychological: Awake alert and oriented x3 mood and affect normal ? ?Skin no lacerations or ulcerations no nodularity no palpable masses, no erythema or nodularity ? ?Musculoskeletal: Left arm is larger than the right there is surrounding soft tissue swelling but no lymphadenopathy painful on the deviation and some pain with wrist extension ? ? ? ? ? ?Past Medical History:  ?Diagnosis Date  ? Allergy   ? Asthma   ? Headache   ? Migraine   ? ? ?No past surgical history on file. ? ?Family History  ?Problem Relation Age of Onset  ? Breast cancer Neg Hx   ? ?Social History  ? ?Tobacco Use  ? Smoking status: Never  ? Smokeless tobacco: Never  ?Substance Use Topics  ? Alcohol use: Yes  ?  Comment: occ  ? Drug use: No  ? ? ?Allergies  ?Allergen Reactions  ? Promethazine Hcl   ? ? ?Current Meds  ?Medication Sig  ? sertraline (ZOLOFT) 100 MG tablet Take 100 mg by mouth daily.   ? ? ? ?MEDICAL DECISION MAKING ? ?A.  ?Encounter Diagnoses  ?Name Primary?  ? Pain in left wrist Yes  ? Mass of left wrist   ? ? ?B. DATA ANALYSED: ? ? ?IMAGING: ?Interpretation of images: I have personally reviewed the images and my interpretation is soft tissue swelling without bony ?The patient tolerated this well fracture or bone  lesion ? ? ?Orders: No new orders ? ?Outside records reviewed: No outside records ? ? ?C. MANAGEMENT  ? ?At this point unclear etiology ? ?Recommend symptomatic  ? ? ?Splinting volar splint applied made in the office ? ?Ibuprofen anti-inflammatory ? ? ?Consult with HAND  as well ? ? ?No orders of the defined types were placed in this encounter. ? ? ? ?Arther Abbott, MD ? ?02/23/2022 ?2:03 PM ?

## 2022-03-07 ENCOUNTER — Encounter: Payer: Self-pay | Admitting: Orthopedic Surgery

## 2022-03-07 ENCOUNTER — Ambulatory Visit: Payer: 59 | Admitting: Orthopedic Surgery

## 2022-03-07 DIAGNOSIS — M25539 Pain in unspecified wrist: Secondary | ICD-10-CM

## 2022-03-07 DIAGNOSIS — M25532 Pain in left wrist: Secondary | ICD-10-CM | POA: Diagnosis not present

## 2022-03-10 DIAGNOSIS — M25539 Pain in unspecified wrist: Secondary | ICD-10-CM | POA: Insufficient documentation

## 2022-03-10 MED ORDER — MELOXICAM 7.5 MG PO TABS: 7.5000 mg | ORAL_TABLET | Freq: Every day | ORAL | 0 refills | Status: AC

## 2022-03-10 NOTE — Progress Notes (Signed)
? ?Office Visit Note ?  ?Patient: Latasha Black           ?Date of Birth: 01-29-1976           ?MRN: [id number] ?Visit Date: 03/07/2022 ?             ?Requested by: Carole Civil, MD ?[address] ?[place],  [place] [address] ?PCP: Lennie Odor, PA ? ? ?Assessment & Plan: ?Visit Diagnoses:  ?1. Pain of ulnar side of wrist   ? ? ?Plan: Discussed with patient that her symptoms seem to be coming from the extensor carpi ulnaris tendon and tendonitis.  Appears x-ray which does not demonstrate any acute bony injury, degenerative change, or evidence of malalignment or instability.  We discussed the nature of ECU tendinitis as well as its diagnosis, prognosis, and conservative treatment options.  For now, we will treat this with immobilization with a wrist brace and consistent oral meloxicam.  Discussed that if the symptoms continue to worsen or do not improve the we can consider an MRI of her wrist to further evaluate the ulnar sided structures.  Otherwise she can follow-up with me again as needed. ? ?Follow-Up Instructions: No follow-ups on file.  ? ?Orders:  ?No orders of the defined types were placed in this encounter. ? ?No orders of the defined types were placed in this encounter. ? ? ? ? Procedures: ?No procedures performed ? ? ?Clinical Data: ?No additional findings. ? ? ?Subjective: ?Chief Complaint  ?Patient presents with  ? Left Wrist - Pain, Weakness  ?  RIGHT Handed, NKI, + weakness, Pain: 3/10, onset x 1 month, has pain anytime she tries to use it  ? ? ?This is a 46 year old right-hand-dominant female who works as a dermatology PA and presents with left ulnar-sided wrist pain.  Is been going on for about a month now.  She denies any overt injury to the wrist.  She describes pain mostly at the dorsal and ulnar aspect of the wrist around the ECU tendon.  Pain is 3/10 at worst.  She does have difficulty with certain tasks at work such as performing biopsies or other procedures.  She has  occasionally taken ibuprofen.  She has a removable brace that she wore for about 2-week.  She was seen by Dr. Aline Brochure who referred her to our office for further management.   ? ?Weakness ?Associated symptoms include weakness.  ? ?Review of Systems  ?Neurological:  Positive for weakness.  ? ? ?Objective: ?Vital Signs: BP 136/84 (BP Location: Right Arm, Patient Position: Sitting)   Pulse (!) 52   Ht '5\' 8"'$  (1.727 m)   Wt 151 lb (68.5 kg)   BMI 22.96 kg/m?  ? ?Physical Exam ?Constitutional:   ?   Appearance: Normal appearance.  ?Cardiovascular:  ?   Rate and Rhythm: Normal rate.  ?   Pulses: Normal pulses.  ?Pulmonary:  ?   Effort: Pulmonary effort is normal.  ?Skin: ?   General: Skin is warm and dry.  ?   Capillary Refill: Capillary refill takes less than 2 seconds.  ?Neurological:  ?   Mental Status: She is alert.  ? ? ?Left Hand Exam  ? ?Tenderness  ?Left hand tenderness location: TTP at the ulnar side of the wrist.  Her tenderness is worse dorsal and ulnar along the ECU tendon. Mild foveal tenderness.  ? ?Range of Motion  ?The patient has normal left wrist ROM. ? ?Muscle Strength  ?The patient has normal left wrist  strength. ? ?Other  ?Erythema: absent ?Sensation: normal ?Pulse: present ? ?Comments:  No DRUJ instability.  Pain w/ ECU synergy test.  No ECU instability.  Full pronation and supination with mild discomfort but without crepitus or instability.  ? ? ? ? ?Specialty Comments:  ?No specialty comments available. ? ?Imaging: ?No results found. ? ? ?PMFS History: ?Patient Active Problem List  ? Diagnosis Date Noted  ? Pain of ulnar side of wrist 03/10/2022  ? Closed right ischial fracture ([place]) 11/25/2015  ? 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 Nonallopathic lesion of lumbosacral region 08/12/2015  ? Poor posture 07/15/2015  ? Nonallopathic lesion of cervical region 07/15/2015  ? Nonallopathic lesion of thoracic region 07/15/2015  ? Nonallopathic lesion-rib  cage 07/15/2015  ? ANKLE PAIN, LEFT 04/15/2010  ? OTHER ENTHESOPATHY OF ANKLE AND TARSUS 04/15/2010  ? ?Past Medical History:  ?Diagnosis Date  ? Allergy   ? Asthma   ? Headache   ? Migraine   ?  ?Family History  ?Problem Relation Age of Onset  ? Breast cancer Neg Hx   ?  ?No past surgical history on file. ?Social History  ? ?Occupational History  ? Not on file  ?Tobacco Use  ? Smoking status: Never  ? Smokeless tobacco: Never  ?Substance and Sexual Activity  ? Alcohol use: Yes  ?  Comment: occ  ? Drug use: No  ? Sexual activity: Not on file  ? ? ? ? ? ? ?

## 2022-07-14 ENCOUNTER — Other Ambulatory Visit (HOSPITAL_COMMUNITY): Payer: Self-pay

## 2023-01-16 ENCOUNTER — Other Ambulatory Visit: Payer: Self-pay | Admitting: Obstetrics & Gynecology

## 2023-01-16 DIAGNOSIS — Z1231 Encounter for screening mammogram for malignant neoplasm of breast: Secondary | ICD-10-CM

## 2023-01-18 ENCOUNTER — Encounter: Payer: Self-pay | Admitting: Radiology

## 2023-02-28 ENCOUNTER — Ambulatory Visit
Admission: RE | Admit: 2023-02-28 | Discharge: 2023-02-28 | Disposition: A | Discharge status: Home / Self Care | Payer: 59 | Source: Ambulatory Visit | Attending: Obstetrics & Gynecology | Admitting: Obstetrics & Gynecology

## 2023-02-28 DIAGNOSIS — Z1231 Encounter for screening mammogram for malignant neoplasm of breast: Secondary | ICD-10-CM

## 2024-02-13 ENCOUNTER — Other Ambulatory Visit: Payer: Self-pay | Admitting: Obstetrics & Gynecology

## 2024-02-13 DIAGNOSIS — Z1231 Encounter for screening mammogram for malignant neoplasm of breast: Secondary | ICD-10-CM

## 2024-03-05 ENCOUNTER — Ambulatory Visit

## 2024-03-13 ENCOUNTER — Ambulatory Visit
Admission: RE | Admit: 2024-03-13 | Discharge: 2024-03-13 | Disposition: A | Discharge status: Home / Self Care | Source: Ambulatory Visit | Attending: Obstetrics & Gynecology | Admitting: Obstetrics & Gynecology

## 2024-03-13 DIAGNOSIS — Z1231 Encounter for screening mammogram for malignant neoplasm of breast: Secondary | ICD-10-CM
